# Patient Record
Sex: Female | Born: 1965 | Race: White | Hispanic: No | Marital: Married | Smoking: Former smoker
Health system: Southern US, Community
[De-identification: ages and names within clinical notes are randomized; demographics above are authoritative.]

## PROBLEM LIST (undated history)

## (undated) DIAGNOSIS — F329 Major depressive disorder, single episode, unspecified: Secondary | ICD-10-CM

## (undated) DIAGNOSIS — F32A Depression, unspecified: Secondary | ICD-10-CM

## (undated) DIAGNOSIS — A64 Unspecified sexually transmitted disease: Secondary | ICD-10-CM

## (undated) DIAGNOSIS — R42 Dizziness and giddiness: Secondary | ICD-10-CM

## (undated) DIAGNOSIS — F419 Anxiety disorder, unspecified: Secondary | ICD-10-CM

## (undated) DIAGNOSIS — F909 Attention-deficit hyperactivity disorder, unspecified type: Secondary | ICD-10-CM

## (undated) HISTORY — DX: Depression, unspecified: F32.A

## (undated) HISTORY — DX: Unspecified sexually transmitted disease: A64

## (undated) HISTORY — PX: ABDOMINAL HYSTERECTOMY: SHX81

## (undated) HISTORY — PX: DENTAL SURGERY: SHX609

## (undated) HISTORY — DX: Anxiety disorder, unspecified: F41.9

## (undated) HISTORY — DX: Major depressive disorder, single episode, unspecified: F32.9

## (undated) HISTORY — DX: Attention-deficit hyperactivity disorder, unspecified type: F90.9

## (undated) HISTORY — PX: TONSILLECTOMY: SUR1361

---

## 1998-09-01 ENCOUNTER — Other Ambulatory Visit: Admission: RE | Admit: 1998-09-01 | Discharge: 1998-09-01 | Payer: Self-pay | Admitting: Gynecology

## 1999-07-19 HISTORY — PX: AUGMENTATION MAMMAPLASTY: SUR837

## 2000-03-13 ENCOUNTER — Other Ambulatory Visit: Admission: RE | Admit: 2000-03-13 | Discharge: 2000-03-13 | Payer: Self-pay | Admitting: Gynecology

## 2001-03-13 ENCOUNTER — Other Ambulatory Visit: Admission: RE | Admit: 2001-03-13 | Discharge: 2001-03-13 | Payer: Self-pay | Admitting: Gynecology

## 2002-03-25 ENCOUNTER — Other Ambulatory Visit: Admission: RE | Admit: 2002-03-25 | Discharge: 2002-03-25 | Payer: Self-pay | Admitting: Gynecology

## 2004-12-05 ENCOUNTER — Emergency Department: Payer: Self-pay | Admitting: Unknown Physician Specialty

## 2005-03-09 ENCOUNTER — Ambulatory Visit: Payer: Self-pay

## 2005-03-23 ENCOUNTER — Ambulatory Visit: Payer: Self-pay | Admitting: Otolaryngology

## 2005-03-23 HISTORY — PX: CYST EXCISION: SHX5701

## 2005-06-22 ENCOUNTER — Other Ambulatory Visit: Admission: RE | Admit: 2005-06-22 | Discharge: 2005-06-22 | Payer: Self-pay | Admitting: Gynecology

## 2007-09-07 ENCOUNTER — Other Ambulatory Visit: Admission: RE | Admit: 2007-09-07 | Discharge: 2007-09-07 | Payer: Self-pay | Admitting: Gynecology

## 2007-09-07 ENCOUNTER — Ambulatory Visit: Payer: Self-pay | Admitting: Family Medicine

## 2008-04-09 ENCOUNTER — Inpatient Hospital Stay (HOSPITAL_COMMUNITY): Admission: EM | Admit: 2008-04-09 | Discharge: 2008-04-15 | Payer: Self-pay | Admitting: Emergency Medicine

## 2008-04-10 ENCOUNTER — Encounter: Payer: Self-pay | Admitting: Internal Medicine

## 2008-04-11 ENCOUNTER — Ambulatory Visit: Payer: Self-pay | Admitting: Hematology and Oncology

## 2008-04-11 ENCOUNTER — Ambulatory Visit: Payer: Self-pay | Admitting: Internal Medicine

## 2008-04-21 ENCOUNTER — Other Ambulatory Visit (HOSPITAL_COMMUNITY): Admission: RE | Admit: 2008-04-21 | Discharge: 2008-07-20 | Payer: Self-pay | Admitting: Psychiatry

## 2009-09-17 ENCOUNTER — Other Ambulatory Visit: Admission: RE | Admit: 2009-09-17 | Discharge: 2009-09-17 | Payer: Self-pay | Admitting: Gynecology

## 2009-09-17 ENCOUNTER — Ambulatory Visit: Payer: Self-pay | Admitting: Women's Health

## 2010-11-30 NOTE — Consult Note (Signed)
NAMEMarland Kitchen  LAWRIE, TUNKS NO.:  [id number]   MEDICAL RECORD NO.:  [id number]          PATIENT TYPE:  INP   LOCATION:                               FACILITY:  [facility]   PHYSICIAN:  Antonietta Breach, M.D.  DATE OF BIRTH:  1965-10-28   DATE OF CONSULTATION:  04/10/2008  DATE OF DISCHARGE:                                 CONSULTATION   REQUESTING PHYSICIAN:  [facility].   REASON FOR CONSULTATION:  Depression, polysubstance overdose.   HISTORY OF PRESENT ILLNESS:  Linda Hubbard is a 45 year old female  admitted to the [facility] on April 09, 2008 after an  overdose.   Linda Hubbard is a limited historian at this time.  She is irritable and  resents addressing her history and treatment with the undersigned.  The  undersigned did not reveal any information to her husband, but her  husband provided history for the undersigned.   Mr. Hubbard states that Linda Hubbard overdosed on medication  approximately 1 month ago in a suicide attempt.  She has been having  depressed mood, low energy, poor concentration and anhedonia as well as  insomnia.  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   She stopped her Prozac and had been on it for antidepression.  She has  been followed by a local psychiatrist in town.   Linda Hubbard was found by her husband in a stupor confused behind the  wheel of her car.  There was an empty bottle of Ambien.  She was brought  to the emergency room and was combative in the emergency room.  She  required chemical restraint with Geodon 20 mg and Atacand 2 mg.  She did  require restraints physically as well.   Please see the undersigned's mental status exam.   PAST PSYCHIATRIC HISTORY:  Linda Hubbard does have a history of prior  suicide attempt approximately 1 month ago.  She has never been a  psychiatric hospital.  She does not have any history of decreased  need  for sleep or increased energy.  However, she does have a history of  several day periods of insomnia where she was miserable and wanted to  sleep.  She has no history of hallucinations.  She has been treated with  Prozac by a local psychiatrist.   FAMILY PSYCHIATRIC HISTORY:  The patient's sister has depression.   SOCIAL HISTORY:  Linda Hubbard lives in [place] with her husband.  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  She does have two children with  the youngest one 71.   Of note, Linda Hubbard's mother died of cancer when Linda Hubbard was 45  years old.   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  At this time, this history is not confirmed.  However, it seems very  likely based upon the husband's description.  He states that Mrs.  Hubbard had a benign lesion removal from her tongue approximately 2  years ago.  He also states  that he has seen the extent of the lesion on  her tongue now.  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   MEDICATIONS:  MAR is reviewed.  Linda Hubbard is on the psychotropic  Ativan 1 mg every hour p.r.n.   No known drug allergies.   LABORATORY DATA:  WBC 10.3, hemoglobin 15.3, platelet count 201.  Sodium  140, BUN 15, creatinine 0.78.  Urine drug screen positive for  amphetamines, benzodiazepines and opioids.  Aspirin, alcohol, Tylenol, HCG, urine all unremarkable.   REVIEW OF SYSTEMS:  CONSTITUTIONAL, HEAD, EYES, EARS, NOSE, THROAT,  MOUTH, NEUROLOGIC, PSYCHIATRIC, CARDIOVASCULAR, RESPIRATORY,  GASTROINTESTINAL, GENITOURINARY, SKIN, MUSCULOSKELETAL, HEMATOLOGIC,  LYMPHATIC, ENDOCRINE METABOLIC:  All negative as far as can be  ascertained from the general medical record and the husband.   EXAMINATION:  VITAL SIGNS:  Temperature 98.3, pulse 83, respiratory rate  22, blood pressure 105/59, O2 saturation on room air 100%.  GENERAL APPEARANCE:   Linda Hubbard is a middle-aged female sitting up  and moving side to side in her hospital bed with no abnormal involuntary  movements.  Her movements are purposeful.   MENTAL STATUS EXAM:  At the time of the examination, Linda Hubbard is  waking up from a nap.  She has mild decreased attention, mild decreased  concentration.  However, she does have coherent thought process.  Her  affect is irritable.  Her mood is depressed.  She is oriented to all  spheres except that she states that she is at [facility].  She  provides limited history.  She clearly resents being seen by the  undersigned.  Although she is alert, after a brief discussion, she lies  down on her bed and will no longer talk to the undersigned.  However,  before she does that she states that this clearly was a suicide attempt  and that she continues to have suicidal intent.  When asked if she would  be willing to proceed with an antidepressant course, she said no.  Also  when asked if she would be willing to have further evaluation and  treatment of her oral buccal lesion, she said no.  Other thought  content:  There is no evidence at this time of hallucinations or  delusions.  Her insight and judgment are grossly impaired.  Her fund of  knowledge and intelligence are grossly within normal limits.   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  Axis II:  Deferred.  Axis III:  See past medical history section above.  Axis IV:  General medical.  Axis V:  15.   Linda Hubbard clearly demonstrates a risk for suicide.  She also has  impaired judgment secondary to her mood states.   Her depression can compromise her capacity for informed consent because  depression can  undermine a patient's ability to appreciate the positive  aspects and elements of once's life and purpose even in the face of  adverse treatment effects and crippling effects of treatment.   RECOMMENDATIONS:  1. Would admit to a psychiatric ward when medically cleared.  2. Would recommend that a specialist be consulted for further      assessment of Linda Hubbard's potential cancer.  3. Ego support to facilitate a therapeutic alliance.  4. Would start Effexor for antidepression at 37.5 mg daily.  Then      would increase while monitoring blood pressure side-effect by 37.5      mg every 2-3 days to the target dose of 150 mg q.a.m.  5. The undersigned spent time with the husband in obtaining history,      in discussing the case with the staff as well as discussing the      case with the attending physician.   Total time on case including paperwork:  2 hours.      Antonietta Breach, M.D.  Electronically Signed     JW/MEDQ  D:  04/10/2008  T:  04/10/2008  Job:  [id number]

## 2010-11-30 NOTE — Consult Note (Signed)
Hubbard, Linda             ACCOUNT NO.:  [id number]   MEDICAL RECORD NO.:  [id number]          PATIENT TYPE:  INP   LOCATION:                               FACILITY:  [facility]   PHYSICIAN:  Lauretta I. Odogwu, M.D.DATE OF BIRTH:  [birth year]   DATE OF CONSULTATION:  04/15/2008  DATE OF DISCHARGE:                                 CONSULTATION   REFERRING PHYSICIAN:  [facility]   REASON FOR CONSULTATION:  Oral cancer.   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  She is currently in the ICU, she is being monitored.  The  patient has a past medical history significant for tobacco use and oral  cancer.  The patient claims that following her diagnosis of tongue  cancer, she received radiation therapy at [facility].  However, she could not recall the physician involved and when  she received these treatments.  It was recently communicated to her that  she would require chemotherapy which led her to commit attempted  suicide.  At this time, no other information is forthcoming from the  patient.  The only remarkable evidence is that there is a slightly  raised plaque area on the tongue.  There is no palpable adenopathy.  The  lab work is essentially unremarkable.   PAST MEDICAL HISTORY:  1. History of depression.  2. History of cancer of the tongue as above.  3. Tobacco use.   SURGERIES:  None.   ALLERGIES:  NKDA.   MEDICATIONS:  Zosyn, Cleocin, vancomycin, Effexor, Protonix, K-Dur,  Lovenox, Haldol, Ativan, Roxicodone, Zofran, ibuprofen.   REVIEW OF SYSTEMS:  See HPI for significant positives.  She has been  experiencing night sweats for the last few months; she states that her  shortness of breath has increased over the last 2 months.  No nausea,  vomiting or diarrhea.  However, she states that recently she has seen  blood in the stools, resolved  with wiping.  Moreover, she complains of  right hip pain consistent with an area of swelling as stated per MRI.  She experienced a weight loss of 15-20 pounds over the last 6 months.  She has fatigue.  Rest of her review of systems is negative.   FAMILY HISTORY:  Mother died with cancer when the patient was 7 years  all.  Her father has unremarkable medical history.  The patient's sister  has a history of depression.   SOCIAL HISTORY:  The patient is married.  She has 4 grown children.  She  has smoked 1-1/2 packs of tobacco a day for the last 20 years.  Occasionally the patient has some alcohol.  Lives in [place].  She  works at the [employer] as a Engineer, civil (consulting).   PHYSICAL EXAMINATION:  GENERAL:  This is a thin 45 year old white female  in no acute distress, alert and oriented x3 but anxious.  VITAL SIGNS:  Blood pressure 92/62, pulse 66, respirations 18,  temperature 98.9, pulse oximetry 98% in room air.  HEENT/NECK:  Normocephalic, atraumatic.  PERRLA.  Oral cavity remarkable  for right white plaques, mildly raised with no other areas of  abnormalities in the tongue.  There is no palpable adenopathy.  In  addition, the left upper lid shows an irregular ulceration, dry,  nontender, which according to the patient has been present for at least  4 years.  LUNGS:  Clear to auscultation bilaterally.  CARDIOVASCULAR:  Regular rate and rhythm without murmurs, rubs or  gallops.  ABDOMEN:  Soft, nontender.  Bowel sounds x4.  No hepatosplenomegaly.  EXTREMITIES:  With no clubbing or cyanosis.  No edema.  No inguinal  masses.  SKIN:  Remarkable for the changes mentioned above in her lip and tongue.  In addition, there is an area of right tenderness to palpation in the  hip with no areas of erythema which is now being evaluated for right hip  effusion versus focal myositis versus muscle abscess versus septic  arthritis or muscle necrosis.  BREASTS:  Not examined.  GU/RECTAL:  Deferred.   MUSCULOSKELETAL:  With no spinal tenderness.  NEURO:  Nonfocal.   LABORATORIES:  Hemoglobin 15.3, hematocrit 45.4, platelets 201, white  count 10.3, MCV 99.3, ANC 6.4, monocytes 0.8, lymphocytes 3, sodium 139,  potassium 3.6, BUN 8, creatinine 0.58, glucose 120, total bilirubin 0.6,  alkaline phosphatase 42, AST 47, ALT 23, total protein 4.4, albumin 2.6,  calcium 7.9, magnesium 1.7.   ASSESSMENT/PLAN:  Dr. Dalene Carrow has seen and evaluated the patient with the  chart.  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  Apparently, this is a diagnosis that has been given to her, but the  history at this point is vague.  It is important to mention that Dr.  Dalene Carrow has discussed with her situation, and she has agreed to release  her information from the [facility] and [facility] for further  review. Once the data arrives from the pertinent hospitals, we will be  able to proceed with further recommendations.  At this time, we will  check a brain CT scan as well as a CT of the neck and chest.  Based on  these results, we will proceed with further recommendations.   Thank you very much for allowing [place] the opportunity to participate in  the care of this patient.      Linda Hubbard, P.A.      Lauretta I. Odogwu, M.D.  Electronically Signed    SW/MEDQ  D:  04/11/2008  T:  04/11/2008  Job:  [id number]

## 2010-11-30 NOTE — Consult Note (Signed)
NAMESAYDI, KOBEL NO.:  [id number]   MEDICAL RECORD NO.:  [id number]          PATIENT TYPE:  INP   LOCATION:                               FACILITY:  [facility]   PHYSICIAN:  Antonietta Breach, M.D.  DATE OF BIRTH:  April 09, 1966   DATE OF CONSULTATION:  04/14/2008  DATE OF DISCHARGE:  04/10/2008                                 CONSULTATION   CONSULTATION FOLLOW UP:   Linda Hubbard is feeling much better now.  She has intact interests and  future hope.  Her energy is still mildly decreased.  Her concentration  is within normal limits.  She is not having any hallucinations or  delusions.  She does acknowledge that she did attempt suicide with the  overdose.   However, she has an appropriate level of regret about the overdose and a  determination to never attempt suicide again.  She has been receiving a  tremendous amount of support from her family and does realize what kind  of impact that a suicide would have on them.  She has been thinking  through her medical condition and is confident that her current  evaluation will provide her some input and a way of dealing with her  medical illness constructively.  She already has received some  encouraging initial findings from her physical exam that suggests that  the lesion on her tongue may not have been as severe as she once had  thought.   REVIEW OF SYSTEMS:  CARDIOVASCULAR:  Her blood pressure has remained  stable without increase while on the Effexor   PHYSICAL EXAMINATION:  VITAL SIGNS:  Temperature 97.5, pulse 71,  respiratory rate 20, blood pressure 100/62.  O2 saturation on room air  98%.   MENTAL STATUS EXAM:  Linda Hubbard is alert.  Her eye contact is good.  Her concentration is within normal limits.  Her attention span is within  normal limits.  Her affect is broad and appropriate.  Mood is slightly  anxious.  She is oriented to all spheres.  Memory is intact to  immediate, recent and remote, except for  the period around the overdose.  Her fund of knowledge and intelligence are within normal limits.  Speech  involves normal rate and prosody without dysarthria.  Thought process is  logical, coherent and goal-directed.  No looseness of associations.  Thought content; no thoughts of harming herself, no thoughts of harming  others, no delusions, no hallucinations.  Insight is intact.  Judgment  is intact.   ASSESSMENT:  AXIS I:  293.83, mood disorder, not otherwise specified.  1. Rule out 296.33, major depressive disorder, recurrent, severe.      Linda Hubbard does appear to have had a significant improvement in      her mood through her psychosocial support with a large number of      people in her support system.   Linda Hubbard is no longer at risk to harm herself.  She does agree to  call emergency services immediately for any thoughts of harming herself,  thoughts of harming others or distress.   The undersigned  provided ego supportive psychotherapy and education.   The indications, alternatives and adverse effects of Effexor were  discussed with the patient for anti-depression, including the risk of  high blood pressure, as well as withdrawal effects.   She understands and would like to proceed with Effexor.   Also the indications, alternatives and adverse effects of trazodone were  reviewed with the patient for anti-insomnia, as well as augmenting  Effexor.  She understands and would like to proceed with trazodone.  She  agrees to not drive if drowsy.   RECOMMENDATIONS:  1. Would start trazodone at 25 mg nightly, and then would increase by      25 mg per night as needed for solid sleep, not to exceed 200 mg      nightly.  2. Would proceed with increasing the Effexor while monitoring blood      pressure to the target dose of 75 mg b.i.d. in the generic form.  3. Linda Hubbard declines inpatient psychiatric care, although      inpatient psychiatric care is recommended.  She  would like to      proceed with intensive outpatient, and she is no longer committable      after recovering from her support.   Therefore, the undersigned will ask the case manager to set Mrs.  Hubbard up with an intensive outpatient program at [facility], [facility], [facility] or [place].  There, she will receive  psychotherapy for coping skills, stress management, as well as  additional medication management.      Antonietta Breach, M.D.  Electronically Signed     JW/MEDQ  D:  04/15/2008  T:  04/15/2008  Job:  [id number]

## 2010-11-30 NOTE — H&P (Signed)
NAMEMarland Hubbard  Linda, Hubbard NO.:  [id number]   MEDICAL RECORD NO.:  [id number]          PATIENT TYPE:  INP   LOCATION:  1229                         FACILITY:  [facility]   PHYSICIAN:  Vania Rea, M.D. DATE OF BIRTH:  1966-02-23   DATE OF ADMISSION:  04/09/2008  DATE OF DISCHARGE:                              HISTORY & PHYSICAL   PRIMARY CARE PHYSICIAN:  Evelene Croon, M.D in [place].   CHIEF COMPLAINT:  Multi drug overdose.   HISTORY OF PRESENT ILLNESS:  This is a 45 year old Caucasian lady who  was brought in by ambulance after she was found confused and drowsy at  the wheel of her car. The patient's husband found her pretty much at the  same time that the sheriff's department found her, and he gives a  history that she has been calling him and other family members last  night telling them that she had been that she had been diagnosed with  cancer of the tongue and had been keeping a secret from them.  They  noted she was supposed to be at work but they he became concerned when  he found out she did not report to work and she was not at home.  The  patient was found with an empty bottle of Ambien inside the car and  husband reports this bottle was filled 3 days ago.  The patient was  brought to the emergency room.  In the emergency room the patient became  more alert and combative and had to be placed in restraints.  Urine drug  screen was checked and was found to be positive for amphetamines,  opiates and benzodiazepines.  Other than this, her history is  incomplete.  The patient lives in [place]. Works at the [employer].  The prescription for Ambien was given by Luvenia Starch .  However, we  been unable to locate that doctor.  It is unclear who is treating the  patient's tongue cancer or what sort of treatment she has had.   PAST MEDICAL HISTORY:  1. History of depression.  2. History of oral cancer of unknown duration.   MEDICATIONS:  1. Dilaudid 4  mg every 4 hours.  2. Ambien 10 mg at bedtime.  3. Multivitamins one daily.   ALLERGIES:  NO KNOWN DRUG ALLERGIES.   SOCIAL HISTORY:  She is a Engineer, civil (consulting) at the [employer].  She smokes probably  one and a half packs per day.  Alcohol use occasionally.  No illicit  drug use known.   FAMILY HISTORY:  The patient's mother reportedly died of cancer when the  patient was age 33.  She has a sister who suffers with depression.   REVIEW OF SYSTEMS:  Other than noted above, 10-point review of systems  is unable to obtain.  The patient is currently complaining of right hip  and thigh pain.   PHYSICAL EXAMINATION:  GENERAL:  A young Caucasian lady lying in the  stretcher in wrist restraints, combative.  VITAL SIGNS:  Temperature is 97.6, pulse 92, respirations 20, blood  pressure 131/76.  She is saturating at 100% on  room air.  HEENT:  Pupils round, equal and reactive.  Mucous membranes pink and  anicteric.  NECK:  She has no cervical lymphadenopathy or thyromegaly or jugular  venous distention.  CHEST:  Clear to auscultation bilaterally.  CARDIOVASCULAR:  Regular rhythm without murmur.  ABDOMEN:  Scaphoid, soft and nontender.  EXTREMITIES:  She is tender over the right buttock and right thigh.  There is no external evidence of bruising and she has no edema.  She has  2+ dorsalis pedis peripheral pulses.  CENTRAL NERVOUS SYSTEM:  She does not have any facial asymmetry and she  moves all limbs equally.   LABORATORY DATA:  Her white count is 10.3, hemoglobin 15.3, MCV 99.3,  platelets 201,000.  She has normal distribution of her white count.  Her  serum chemistry and liver function tests are remarkable only for  slightly elevated AST.  Her urinalysis was significant only for trace  ketones and specific gravity of 1.027.  Her urine drug screen was  positive for opiates, benzodiazepines and amphetamines.   ASSESSMENT:  1. Ambien overdose, possibly multi drug overdose, possible      polysubstance  abuse in view of amphetamines and benzodiazepines on      urine drug screen.  2. History of oral cancer.  3. Parasuicide.   PLAN:  Will admit this lady for observation for safety. She will be  given IV fluids and hopefully within 24 hours, the drugs will be out of  her system so she can be evaluated by the [facility].  When the patient is more alert and appropriate, we should be able to get  further details of her primary physicians and her current status of  medical care, particularly in regards to cancer.      Vania Rea, M.D.  Electronically Signed     LC/MEDQ  D:  04/09/2008  T:  04/10/2008  Job:  [id number]   cc:   Evelene Croon  Fax: [phone]

## 2010-11-30 NOTE — Discharge Summary (Signed)
NAMEBLAKE, GOYA NO.:  [id number]   MEDICAL RECORD NO.:  [id number]          PATIENT TYPE:  INP   LOCATION:  1518                         FACILITY:  [facility]   PHYSICIAN:  Eduard Clos, MDDATE OF BIRTH:  [birth year]   DATE OF ADMISSION:  04/09/2008  DATE OF DISCHARGE:                               DISCHARGE SUMMARY   COURSE IN THE HOSPITAL:  A 45 year old female with a history of  depression and questionable history of oral cancer was brought into the  ER by the family after the patient was found confused and drowsy at the  wheel of her car.  She was brought to the ER, and her urine tox was  positive for amphetamines, opiates, and benzodiazepine.  Patient was  initially drowsy and admitted to the ICU.  Patient was placed on IV  fluids, n.p.o., and a sitter.  Once the patient became more alert and  awake, the patient stated that she was diagnosed with oral cancer and  was treated.  She is supposed to get some surgery for her tongue when  she decided towards suicide.  A psychiatry consult was obtained with Dr.  Jeanie Sewer, who advised her to get an oncologic consult to determine the  stage of her cancer.  Her oncologist, patient underwent a CT chest,  neck, and head, which did not show any significant findings.  The  patient stated that she had treatment done at [facility], for  which we were not able to retrieve any medical records pertaining to her  treatments.  An ENT consult was obtained with Dr. Suszanne Conners, who did  laryngoscope and tongue biopsy, which results are pending now.  At this  time, Dr. Jeanie Sewer did reasseess the patient and started her on  trazodone and Effexor and says she can be followed as an outpatient, and  she is no more suicidal.   During this stay, the patient also did complain of significant pain in  the right hip for which an MRI was done, which showed evidence of  abnormal musculature on the right hip with right hip effusion,  positive  with focal myositis, abscess,  muscle necrosis should be considered as  well as necrotizing fascitis.  An aspiration biopsy was done, which did  not yield any organism.  An infectious disease was obtained with Dr.  Orvan Falconer.  Initially antibiotics were started, which were discontinued  eventually.  At this time, the patient's hip pain is controlled.   At this time, patient is not entertaining any suicidal ideation.  Patient's tongue biopsies are pending.  The patient's family is in  support and in agreement in taking care of her on discharge and will be  following her pending results with Dr. Suszanne Conners to follow up with primary  care physician within a week's time and outpatient psychiatry.   PROCEDURES:  MRI of pelvis shows evidence of abnormal musculature on the  right hip with right hip effusion, possibly due to focal myositis with  an abscess of septic arthritis, muscle necrosis should be considered as  well as necrotizing fasciitis.   CT aspiration done:  No free fluid obtained from the area of the  abnormality in the anterolateral gluteal musculature.   CT of the head without contrast.  Normal examination.  No acute  findings.  No metastatic disease.   CT of the neck:  No mass demonstrated.  No cervical adenopathy.   CT of the chest:  No evidence of thoracic metastatic disease.  No  changes.   FINAL DIAGNOSIS:  1. Depression with intentional suicidal overdose.  Amphetamine,      opiates, and benzodiazepines.  2. History of oral cancer, per patient.  Status post tongue biopsy.  3. Cigarette abuse.   MEDICATIONS ON DISCHARGE:  1. Trazodone 25 mg p.o. at bedtime.  2. Effexor XR 75 mg p.o. in the a.m. and 37.5 mg p.o. at bedtime.  3. Percocet 5/325 mg p.o. q.12h. p.r.n. pain.   PLAN:  Patient is to follow up with her primary care physician, Dr.  Jaynie Crumble in a week's time.  To follow with Dr. Suszanne Conners, ENT surgeon, in a  week's time to follow her pending biopsy results.   Patient has been  provided with the contact information of Dr. Suszanne Conners at [phone] and  was told to call and make an appointment to follow with [facility] as scheduled.  Patient is to have a regular diet.      Eduard Clos, MD  Electronically Signed     ANK/MEDQ  D:  04/15/2008  T:  04/15/2008  Job:  [id number]

## 2011-02-12 ENCOUNTER — Ambulatory Visit: Payer: Self-pay | Admitting: Orthopedic Surgery

## 2011-02-16 ENCOUNTER — Other Ambulatory Visit: Payer: Self-pay | Admitting: *Deleted

## 2011-02-16 MED ORDER — VALACYCLOVIR HCL 500 MG PO TABS: 500.0000 mg | ORAL_TABLET | Freq: Every day | ORAL | Status: AC

## 2011-04-18 LAB — CBC
MCHC: 33.6
MCV: 99.3
Platelets: 173
Platelets: 201
RDW: 13.1
RDW: 13.3

## 2011-04-18 LAB — COMPREHENSIVE METABOLIC PANEL
ALT: 19
ALT: 23
AST: 47 — ABNORMAL HIGH
AST: 50 — ABNORMAL HIGH
Albumin: 3.6
Alkaline Phosphatase: 44
BUN: 7
CO2: 24
CO2: 25
Calcium: 8.9
Chloride: 113 — ABNORMAL HIGH
Creatinine, Ser: 0.58
Creatinine, Ser: 0.69
Creatinine, Ser: 0.78
GFR calc Af Amer: >60
GFR calc Af Amer: >60
GFR calc non Af Amer: >60
GFR calc non Af Amer: >60
Glucose, Bld: 120 — ABNORMAL HIGH
Glucose, Bld: 94
Potassium: 4
Sodium: 139
Sodium: 140
Total Bilirubin: 0.6
Total Bilirubin: 0.7
Total Protein: 4.6 — ABNORMAL LOW
Total Protein: 6

## 2011-04-18 LAB — ANAEROBIC CULTURE

## 2011-04-18 LAB — RAPID URINE DRUG SCREEN, HOSP PERFORMED
Amphetamines: POSITIVE — AB
Barbiturates: NOT DETECTED
Benzodiazepines: POSITIVE — AB
Tetrahydrocannabinol: NOT DETECTED

## 2011-04-18 LAB — BASIC METABOLIC PANEL
CO2: 28
Calcium: 8.3 — ABNORMAL LOW
GFR calc Af Amer: >60
GFR calc non Af Amer: >60
Sodium: 139

## 2011-04-18 LAB — DIFFERENTIAL
Eosinophils Relative: 0
Lymphocytes Relative: 29
Lymphs Abs: 3
Monocytes Relative: 8

## 2011-04-18 LAB — URINALYSIS, ROUTINE W REFLEX MICROSCOPIC
Bilirubin Urine: NEGATIVE
Glucose, UA: NEGATIVE
Hgb urine dipstick: NEGATIVE
Nitrite: NEGATIVE
Protein, ur: NEGATIVE
Specific Gravity, Urine: 1.027
Urobilinogen, UA: 0.2
pH: 6.5

## 2011-04-18 LAB — ACETAMINOPHEN LEVEL: Acetaminophen (Tylenol), Serum: <10 — ABNORMAL LOW

## 2011-04-18 LAB — CK: Total CK: 780 — ABNORMAL HIGH

## 2011-07-04 ENCOUNTER — Other Ambulatory Visit: Payer: Self-pay | Admitting: *Deleted

## 2011-07-06 MED ORDER — VALACYCLOVIR HCL 500 MG PO TABS: 500.0000 mg | ORAL_TABLET | Freq: Two times a day (BID) | ORAL | Status: AC

## 2013-05-22 ENCOUNTER — Emergency Department: Payer: Self-pay | Admitting: Emergency Medicine

## 2014-12-18 ENCOUNTER — Telehealth: Payer: Self-pay | Admitting: Psychiatry

## 2014-12-23 MED ORDER — AMPHETAMINE-DEXTROAMPHET ER 20 MG PO CP24: 20.0000 mg | ORAL_CAPSULE | Freq: Two times a day (BID) | ORAL | Status: DC

## 2014-12-23 NOTE — Telephone Encounter (Signed)
Patient is content to the clinic requesting a refill of her Adderall 20 mg, twice daily. 20 mg tablets #60 with no refills  Records she is due for this refill. Will provide her copy prescription and patient will come by and pick up prescription.

## 2015-01-21 ENCOUNTER — Telehealth: Payer: Self-pay | Admitting: Psychiatry

## 2015-01-26 MED ORDER — AMPHETAMINE-DEXTROAMPHETAMINE 20 MG PO TABS: 20.0000 mg | ORAL_TABLET | Freq: Two times a day (BID) | ORAL | Status: DC

## 2015-01-26 MED ORDER — AMPHETAMINE-DEXTROAMPHET ER 20 MG PO CP24: 20.0000 mg | ORAL_CAPSULE | Freq: Two times a day (BID) | ORAL | Status: DC

## 2015-02-19 ENCOUNTER — Ambulatory Visit (INDEPENDENT_AMBULATORY_CARE_PROVIDER_SITE_OTHER): Payer: PRIVATE HEALTH INSURANCE | Admitting: Psychiatry

## 2015-02-19 ENCOUNTER — Encounter: Payer: Self-pay | Admitting: Psychiatry

## 2015-02-19 VITALS — BP 124/78 | HR 80 | Temp 98.5°F | Ht 62.0 in | Wt 153.2 lb

## 2015-02-19 DIAGNOSIS — F900 Attention-deficit hyperactivity disorder, predominantly inattentive type: Secondary | ICD-10-CM

## 2015-02-19 DIAGNOSIS — F411 Generalized anxiety disorder: Secondary | ICD-10-CM | POA: Diagnosis not present

## 2015-02-19 MED ORDER — AMPHETAMINE-DEXTROAMPHET ER 20 MG PO CP24: 20.0000 mg | ORAL_CAPSULE | Freq: Two times a day (BID) | ORAL | Status: DC

## 2015-02-19 MED ORDER — ZOLPIDEM TARTRATE 10 MG PO TABS: 10.0000 mg | ORAL_TABLET | Freq: Every evening | ORAL | Status: DC | PRN

## 2015-02-19 MED ORDER — ALPRAZOLAM 0.25 MG PO TABS: 0.2500 mg | ORAL_TABLET | Freq: Three times a day (TID) | ORAL | Status: DC | PRN

## 2015-02-19 NOTE — Progress Notes (Signed)
BH MD/PA/NP OP Progress Note  02/19/2015 10:52 AM Linda Hubbard  MRN:  [id number]  Subjective:  Patient returns for follow-up for her ADHD and anxiety. She explained in this appointment that some of her issues with returning to work stem from some past trauma and stalking issues from a former relationship. She stated she has fear of this particular person and thus largely stays in her home and avoids situations such as going to the grocery store. She states even discussing the issue can bring her intense anxiety. She relates that she is not experiencing any stalking recently but she does have the fear that it is going on. For example she states if she finds a cigarette but as the stocker used to smoke cigarettes that he may have been in the area. Patient raised that she might have some PTSD. I agreed she had some symptoms of PTSD and discussed that some options for that might be antidepressants and/or therapy. Patient states that she is not interested in those that she had multiple side effects from antidepressants. She states that in regards to therapy she's not interested in it at this time and she does fine with symptoms when she is out of this geographical area such as on an out-of-state vacation with her family. She dates her husband has been supportive in this issue. He says they have several upcoming vacations and trips which she looks forward to.  As in past visits she has discussed that the Adderall may not be effective for her as it was in the past. She discussed some other medications which she has had problems with. She states the Concerta made her irritable and angry. She states Vyvanse she had only been on a low dose of she's not sure about that. However she recalls that this writer had given her some Adderall XR in the past and she feels like that actually was more effective than the immediate release and prefers to try that at this time. I discussed that it so once a day medicine however she  stated when she took it once a day it put her to sleep and thus when she divides the dose it is effective for her.  Chief Complaint: Stalking Chief Complaint    Follow-up; Medication Refill; Panic Attack; Anxiety; Fatigue     Visit Diagnosis:  No diagnosis found.  Past Medical History:  Past Medical History  Diagnosis Date  . Anxiety   . ADHD (attention deficit hyperactivity disorder)   . Depression     Past Surgical History  Procedure Laterality Date  . Abdominal hysterectomy    . Tonsillectomy     Family History:  Family History  Problem Relation Age of Onset  . Breast cancer Mother   . Hypertension Sister   . Alcohol abuse Maternal Grandfather   . Diabetes Maternal Grandmother   . Atrial fibrillation Maternal Grandmother    Social History:  History   Social History  . Marital Status: Married    Spouse Name: N/A  . Number of Children: N/A  . Years of Education: N/A   Social History Main Topics  . Smoking status: Former Smoker -- 1.00 packs/day    Types: Cigarettes    Start date: 02/18/1985    Quit date: 02/18/2013  . Smokeless tobacco: Never Used  . Alcohol Use: No  . Drug Use: No  . Sexual Activity: No   Other Topics Concern  . None   Social History Narrative  . None   Additional  History:   Assessment:   Musculoskeletal: Strength & Muscle Tone: within normal limits Gait & Station: normal Patient leans: N/A  Psychiatric Specialty Exam: HPI  Review of Systems  Psychiatric/Behavioral: Negative for depression, suicidal ideas, hallucinations, memory loss and substance abuse. The patient is nervous/anxious (some general worries about children and stalking as discussed above) and has insomnia (Response to Ambien).     Blood pressure 124/78, pulse 80, temperature 98.5 F (36.9 C), temperature source Tympanic, height  (1.575 m), weight 153 lb 3.2 oz (69.491 kg), SpO2 96 %.Body mass index is 28.01 kg/(m^2).  General Appearance: Neat and Well  Groomed  Eye Contact:  Good  Speech:  Normal Rate  Volume:  Normal  Mood:  Good  Affect:  Euthymic, anxious when discussing the issues with the stalking  Thought Process:  Linear and Logical  Orientation:  Full (Time, Place, and Person)  Thought Content:  Negative  Suicidal Thoughts:  No  Homicidal Thoughts:  No  Memory:  Immediate;   Good Recent;   Good Remote;   Good  Judgement:  Good  Insight:  Good  Psychomotor Activity:  Negative  Concentration:  Good  Recall:  Good  Fund of Knowledge: Good  Language: Good  Akathisia:  Negative  Handed:  Right unknown   AIMS (if indicated):  n/A  Assets:  Communication Skills Desire for Improvement Social Support Vocational/Educational  ADL's:  Intact  Cognition: WNL  Sleep:  good   Is the patient at risk to self?  No. Has the patient been a risk to self in the past 6 months?  No. Has the patient been a risk to self within the distant past?  No. Is the patient a risk to others?  No. Has the patient been a risk to others in the past 6 months?  No. Has the patient been a risk to others within the distant past?  No.  Current Medications: Current Outpatient Prescriptions  Medication Sig Dispense Refill  . ALPRAZolam (XANAX) 0.25 MG tablet Take 1 tablet (0.25 mg total) by mouth 3 (three) times daily as needed for anxiety. 90 tablet 3  . amphetamine-dextroamphetamine (ADDERALL XR) 20 MG 24 hr capsule Take 1 capsule (20 mg total) by mouth 2 (two) times daily. 60 capsule 0  . amphetamine-dextroamphetamine (ADDERALL) 20 MG tablet Take 1 tablet (20 mg total) by mouth 2 (two) times daily. 60 tablet 0  . valACYclovir (VALTREX) 1000 MG tablet take 2 tablets by mouth twice a day for ONE DAY TAKEN 12 HOURS APART  0  . zolpidem (AMBIEN) 10 MG tablet Take 1 tablet (10 mg total) by mouth at bedtime as needed for sleep. 30 tablet 3   No current facility-administered medications for this visit.    Medical Decision Making:  Established Problem,  Stable/Improving (1) and Review of Medication Regimen & Side Effects (2)  Treatment Plan Summary:Medication management and Plan Patient has remained stable on the current medication regimen. We will continue her on her medications. However we'll change her Adderall regular release to Adderall XR. We will continue her on her Ambien 10 mg at bedtime, patient states and then she'll take a half a tablet instead of a whole tablet. We'll continue her on her alprazolam 0.25 mg 3 times a day as needed for anxiety. Patient will follow up in 4 months. She is aware she should call with any questions concerns prior to next appointment and for refills for her Adderall XR.   Wallace Going 02/19/2015, 10:52 AM

## 2015-03-19 ENCOUNTER — Telehealth: Payer: Self-pay

## 2015-03-19 MED ORDER — AMPHETAMINE-DEXTROAMPHET ER 20 MG PO CP24: 20.0000 mg | ORAL_CAPSULE | Freq: Two times a day (BID) | ORAL | Status: DC

## 2015-03-19 NOTE — Telephone Encounter (Signed)
phone number disconnect could not leave a message

## 2015-03-19 NOTE — Telephone Encounter (Signed)
pt needs refill on adderall xr , pt last seen on 02-19-15 . next appt 06-22-15.

## 2015-03-24 NOTE — Telephone Encounter (Signed)
pt came by to pick up rx

## 2015-04-22 ENCOUNTER — Telehealth: Payer: Self-pay | Admitting: Psychiatry

## 2015-04-22 MED ORDER — AMPHETAMINE-DEXTROAMPHET ER 20 MG PO CP24: 20.0000 mg | ORAL_CAPSULE | Freq: Two times a day (BID) | ORAL | Status: DC

## 2015-04-22 NOTE — Telephone Encounter (Signed)
rx is ready for pick up.  adderall xr   id # [id number]  order # [id number].

## 2015-05-29 ENCOUNTER — Other Ambulatory Visit: Payer: Self-pay

## 2015-05-29 MED ORDER — AMPHETAMINE-DEXTROAMPHET ER 20 MG PO CP24: 20.0000 mg | ORAL_CAPSULE | Freq: Two times a day (BID) | ORAL | Status: DC

## 2015-05-29 NOTE — Telephone Encounter (Signed)
Could not leave a message pt mail box was full pt was sent a page with our phone number , pt rx for adderall xr is ready for pick up id# [id number] order # [id number]

## 2015-05-29 NOTE — Telephone Encounter (Signed)
pt called states she needs refill on adderall pt last seen  02-19-15 next appt 06-22-15

## 2015-05-29 NOTE — Telephone Encounter (Signed)
pt was called, pt was told that rx was ready for pickup. pt states it will be monday.

## 2015-06-22 ENCOUNTER — Ambulatory Visit: Payer: PRIVATE HEALTH INSURANCE | Admitting: Psychiatry

## 2015-06-26 ENCOUNTER — Ambulatory Visit (INDEPENDENT_AMBULATORY_CARE_PROVIDER_SITE_OTHER): Payer: PRIVATE HEALTH INSURANCE | Admitting: Psychiatry

## 2015-06-26 ENCOUNTER — Encounter: Payer: Self-pay | Admitting: Psychiatry

## 2015-06-26 DIAGNOSIS — F900 Attention-deficit hyperactivity disorder, predominantly inattentive type: Secondary | ICD-10-CM

## 2015-06-26 DIAGNOSIS — F411 Generalized anxiety disorder: Secondary | ICD-10-CM | POA: Diagnosis not present

## 2015-06-26 MED ORDER — ALPRAZOLAM 0.25 MG PO TABS: 0.2500 mg | ORAL_TABLET | Freq: Three times a day (TID) | ORAL | Status: DC | PRN

## 2015-06-26 MED ORDER — ZOLPIDEM TARTRATE 10 MG PO TABS
10.0000 mg | ORAL_TABLET | Freq: Every evening | ORAL | Status: DC | PRN
Start: 2015-06-26 — End: 2015-09-18

## 2015-06-26 MED ORDER — AMPHETAMINE-DEXTROAMPHET ER 30 MG PO CP24: 30.0000 mg | ORAL_CAPSULE | Freq: Every day | ORAL | Status: DC

## 2015-06-26 NOTE — Progress Notes (Signed)
BH MD/PA/NP OP Progress Note  06/26/2015 12:56 PM Linda Hubbard  MRN:  [id number]  Subjective:  Patient returns for follow-up for her ADHD and anxiety. She related overall her medications are "good." She did state that she was talking things over with her husband in considering at some point coming off of these medications. I discussed with her that I felt it might be better that her Adderall be taken once a day given a XR and that we also reduce it to 30 mg in the morning. Patient was agreeable to this plan.  Otherwise she is feeling some stress from the holidays because she has a tendency to get overly involved and bring on stress and anxiety. She states that she's made efforts over the years to simplify things.  Chief Complaint: Holidays  Visit Diagnosis:     ICD-9-CM ICD-10-CM   1. 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 Anxiety state 300.00 F41.1     Past Medical History:  Past Medical History  Diagnosis Date  . Anxiety   . ADHD (attention deficit hyperactivity disorder)   . Depression     Past Surgical History  Procedure Laterality Date  . Abdominal hysterectomy    . Tonsillectomy     Family History:  Family History  Problem Relation Age of Onset  . Breast cancer Mother   . Hypertension Sister   . Alcohol abuse Maternal Grandfather   . Diabetes Maternal Grandmother   . Atrial fibrillation Maternal Grandmother    Social History:  Social History   Social History  . Marital Status: Married    Spouse Name: N/A  . Number of Children: N/A  . Years of Education: N/A   Social History Main Topics  . Smoking status: Former Smoker -- 1.00 packs/day    Types: Cigarettes    Start date: 02/18/1985    Quit date: 02/18/2013  . Smokeless tobacco: Never Used  . Alcohol Use: No  . Drug Use: No  . Sexual Activity: No   Other Topics Concern  . Not on file   Social History Narrative  . No narrative on file    Additional History:   Assessment:   Musculoskeletal: Strength & Muscle Tone: within normal limits Gait & Station: normal Patient leans: N/A  Psychiatric Specialty Exam: HPI  Review of Systems  Psychiatric/Behavioral: Negative for depression, suicidal ideas, hallucinations, memory loss and substance abuse. The patient is nervous/anxious (some general worries about children and stalking as discussed above) and has insomnia (Response to Ambien).   All other systems reviewed and are negative.   There were no vitals taken for this visit.There is no weight on file to calculate BMI.  General Appearance: Neat and Well Groomed  Eye Contact:  Good  Speech:  Normal Rate  Volume:  Normal  Mood:  Good  Affect:  Congruent  Thought Process:  Linear and Logical  Orientation:  Full (Time, Place, and Person)  Thought Content:  Negative  Suicidal Thoughts:  No  Homicidal Thoughts:  No  Memory:  Immediate;   Good Recent;   Good Remote;   Good  Judgement:  Good  Insight:  Good  Psychomotor Activity:  Negative  Concentration:  Good  Recall:  Good  Fund of Knowledge: Good  Language: Good  Akathisia:  Negative  Handed:  Right unknown   AIMS (if indicated):  n/A  Assets:  Communication Skills Desire for Improvement Social Support Vocational/Educational  ADL's:  Intact  Cognition:  WNL  Sleep:  good   Is the patient at risk to self?  No. Has the patient been a risk to self in the past 6 months?  No. Has the patient been a risk to self within the distant past?  No. Is the patient a risk to others?  No. Has the patient been a risk to others in the past 6 months?  No. Has the patient been a risk to others within the distant past?  No.  Current Medications: Current Outpatient Prescriptions  Medication Sig Dispense Refill  . ALPRAZolam (XANAX) 0.25 MG tablet Take 1 tablet (0.25 mg total) by mouth 3 (three) times daily as needed for anxiety. 90 tablet 5  . amphetamine-dextroamphetamine  (ADDERALL XR) 30 MG 24 hr capsule Take 1 capsule (30 mg total) by mouth daily. 30 capsule 0  . valACYclovir (VALTREX) 1000 MG tablet take 2 tablets by mouth twice a day for ONE DAY TAKEN 12 HOURS APART  0  . zolpidem (AMBIEN) 10 MG tablet Take 1 tablet (10 mg total) by mouth at bedtime as needed for sleep. 30 tablet 5   No current facility-administered medications for this visit.    Medical Decision Making:  Established Problem, Stable/Improving (1) and Review of Medication Regimen & Side Effects (2)  Treatment Plan Summary:Medication management and Plan   ADHD we'll decrease her Adderall XR from 20 mg twice a day to 30 mg once a day. Patient is fine with this and she does plan ultimately to come off of these medications.   Anxiety We'll continue her on her alprazolam 0.25 mg 3 times a day as needed for anxiety.   Tommy-we will continue her Ambien 10 mg at bedtime as needed.  Patient will follow up in 4 months. She is aware she should call with any questions concerns prior to next appointment and for refills for her Adderall XR. I have given her prescription for Adderall XR to fill this month and an additional 1 to fill in January. She is aware after that she left contact the clinic for additional refills. I also discussed with her my departure in February and that she would be transferred to a new psychiatrist within this clinic.  Wallace Going 06/26/2015, 12:56 PM

## 2015-09-16 ENCOUNTER — Encounter (HOSPITAL_COMMUNITY): Payer: Self-pay | Admitting: Emergency Medicine

## 2015-09-16 ENCOUNTER — Emergency Department (HOSPITAL_COMMUNITY): Payer: 59

## 2015-09-16 ENCOUNTER — Observation Stay (HOSPITAL_COMMUNITY)
Admission: EM | Admit: 2015-09-16 | Discharge: 2015-09-18 | Payer: 59 | Attending: Internal Medicine | Admitting: Internal Medicine

## 2015-09-16 DIAGNOSIS — F489 Nonpsychotic mental disorder, unspecified: Secondary | ICD-10-CM | POA: Diagnosis not present

## 2015-09-16 DIAGNOSIS — G9340 Encephalopathy, unspecified: Secondary | ICD-10-CM | POA: Diagnosis not present

## 2015-09-16 DIAGNOSIS — T68XXXA Hypothermia, initial encounter: Secondary | ICD-10-CM | POA: Diagnosis not present

## 2015-09-16 DIAGNOSIS — F909 Attention-deficit hyperactivity disorder, unspecified type: Secondary | ICD-10-CM | POA: Insufficient documentation

## 2015-09-16 DIAGNOSIS — F419 Anxiety disorder, unspecified: Secondary | ICD-10-CM | POA: Diagnosis not present

## 2015-09-16 DIAGNOSIS — T50901A Poisoning by unspecified drugs, medicaments and biological substances, accidental (unintentional), initial encounter: Secondary | ICD-10-CM | POA: Diagnosis present

## 2015-09-16 DIAGNOSIS — F329 Major depressive disorder, single episode, unspecified: Secondary | ICD-10-CM | POA: Diagnosis not present

## 2015-09-16 DIAGNOSIS — T50902A Poisoning by unspecified drugs, medicaments and biological substances, intentional self-harm, initial encounter: Secondary | ICD-10-CM | POA: Diagnosis not present

## 2015-09-16 DIAGNOSIS — E876 Hypokalemia: Secondary | ICD-10-CM | POA: Insufficient documentation

## 2015-09-16 DIAGNOSIS — Z915 Personal history of self-harm: Secondary | ICD-10-CM | POA: Diagnosis not present

## 2015-09-16 DIAGNOSIS — Z87891 Personal history of nicotine dependence: Secondary | ICD-10-CM | POA: Insufficient documentation

## 2015-09-16 DIAGNOSIS — R4689 Other symptoms and signs involving appearance and behavior: Secondary | ICD-10-CM

## 2015-09-16 DIAGNOSIS — Y9289 Other specified places as the place of occurrence of the external cause: Secondary | ICD-10-CM | POA: Diagnosis not present

## 2015-09-16 DIAGNOSIS — R4589 Other symptoms and signs involving emotional state: Secondary | ICD-10-CM | POA: Diagnosis present

## 2015-09-16 LAB — CBC WITH DIFFERENTIAL/PLATELET
BASOS ABS: 0 10*3/uL (ref 0.0–0.1)
BASOS PCT: 0 %
EOS ABS: 0 10*3/uL (ref 0.0–0.7)
Eosinophils Relative: 0 %
HEMATOCRIT: 44.7 % (ref 36.0–46.0)
HEMOGLOBIN: 14.6 g/dL (ref 12.0–15.0)
LYMPHS ABS: 1.2 10*3/uL (ref 0.7–4.0)
Lymphocytes Relative: 15 %
MCH: 30.5 pg (ref 26.0–34.0)
MCHC: 32.7 g/dL (ref 30.0–36.0)
MCV: 93.5 fL (ref 78.0–100.0)
MONOS PCT: 3 %
Monocytes Absolute: 0.3 10*3/uL (ref 0.1–1.0)
NEUTROS PCT: 82 %
Neutro Abs: 6.8 10*3/uL (ref 1.7–7.7)
Platelets: 251 10*3/uL (ref 150–400)
RBC: 4.78 MIL/uL (ref 3.87–5.11)
RDW: 13.2 % (ref 11.5–15.5)
WBC: 8.2 10*3/uL (ref 4.0–10.5)

## 2015-09-16 LAB — COMPREHENSIVE METABOLIC PANEL
ALBUMIN: 3.9 g/dL (ref 3.5–5.0)
ALK PHOS: 74 U/L (ref 38–126)
ALT: 23 U/L (ref 14–54)
AST: 22 U/L (ref 15–41)
Anion gap: 7 (ref 5–15)
BILIRUBIN TOTAL: 0.7 mg/dL (ref 0.3–1.2)
BUN: 19 mg/dL (ref 6–20)
CALCIUM: 9 mg/dL (ref 8.9–10.3)
CO2: 25 mmol/L (ref 22–32)
CREATININE: 0.7 mg/dL (ref 0.44–1.00)
Chloride: 106 mmol/L (ref 101–111)
GFR calc Af Amer: >60 mL/min (ref >60)
GFR calc non Af Amer: >60 mL/min (ref >60)
GLUCOSE: 117 mg/dL — AB (ref 65–99)
Potassium: 4.4 mmol/L (ref 3.5–5.1)
SODIUM: 138 mmol/L (ref 135–145)
TOTAL PROTEIN: 6.9 g/dL (ref 6.5–8.1)

## 2015-09-16 LAB — URINALYSIS, ROUTINE W REFLEX MICROSCOPIC
Bilirubin Urine: NEGATIVE
Bilirubin Urine: NEGATIVE
GLUCOSE, UA: NEGATIVE mg/dL
GLUCOSE, UA: NEGATIVE mg/dL
HGB URINE DIPSTICK: NEGATIVE
Hgb urine dipstick: NEGATIVE
KETONES UR: NEGATIVE mg/dL
Ketones, ur: NEGATIVE mg/dL
LEUKOCYTES UA: NEGATIVE
LEUKOCYTES UA: NEGATIVE
Nitrite: NEGATIVE
Nitrite: NEGATIVE
PROTEIN: NEGATIVE mg/dL
PROTEIN: NEGATIVE mg/dL
Specific Gravity, Urine: 1.022 (ref 1.005–1.030)
Specific Gravity, Urine: 1.011 (ref 1.005–1.030)
pH: 5.5 (ref 5.0–8.0)
pH: 5.5 (ref 5.0–8.0)

## 2015-09-16 LAB — RAPID URINE DRUG SCREEN, HOSP PERFORMED
Amphetamines: POSITIVE — AB
BARBITURATES: NOT DETECTED
Benzodiazepines: POSITIVE — AB
COCAINE: NOT DETECTED
Opiates: NOT DETECTED
TETRAHYDROCANNABINOL: NOT DETECTED

## 2015-09-16 LAB — MRSA PCR SCREENING: MRSA BY PCR: NEGATIVE

## 2015-09-16 LAB — SALICYLATE LEVEL

## 2015-09-16 LAB — HCG, SERUM, QUALITATIVE: PREG SERUM: NEGATIVE

## 2015-09-16 LAB — CBG MONITORING, ED: GLUCOSE-CAPILLARY: 84 mg/dL (ref 65–99)

## 2015-09-16 LAB — ACETAMINOPHEN LEVEL: Acetaminophen (Tylenol), Serum: <10 ug/mL — ABNORMAL LOW (ref 10–30)

## 2015-09-16 LAB — ETHANOL: Alcohol, Ethyl (B): <5 mg/dL (ref <5)

## 2015-09-16 MED ORDER — ONDANSETRON HCL 4 MG/2ML IJ SOLN: 4.0000 mg | Freq: Four times a day (QID) | INTRAMUSCULAR | Status: DC | PRN

## 2015-09-16 MED ORDER — NALOXONE HCL 2 MG/2ML IJ SOSY: 1.0000 mg | PREFILLED_SYRINGE | Freq: Once | INTRAMUSCULAR | Status: DC

## 2015-09-16 MED ORDER — ONDANSETRON HCL 4 MG PO TABS: 4.0000 mg | ORAL_TABLET | Freq: Four times a day (QID) | ORAL | Status: DC | PRN

## 2015-09-16 MED ORDER — SODIUM CHLORIDE 0.9 % IV BOLUS (SEPSIS)
1000.0000 mL | Freq: Once | INTRAVENOUS | Status: AC
  Administered 2015-09-16: 1000 mL via INTRAVENOUS

## 2015-09-16 MED ORDER — NALOXONE HCL 0.4 MG/ML IJ SOLN
INTRAMUSCULAR | Status: AC
  Administered 2015-09-16: 0.4 mg
  Filled 2015-09-16: qty 1

## 2015-09-16 MED ORDER — SODIUM CHLORIDE 0.9 % IV SOLN
INTRAVENOUS | Status: DC
  Administered 2015-09-16: 16:00:00 via INTRAVENOUS
  Administered 2015-09-17: 1000 mL via INTRAVENOUS

## 2015-09-16 MED ORDER — ENOXAPARIN SODIUM 40 MG/0.4ML ~~LOC~~ SOLN
40.0000 mg | SUBCUTANEOUS | Status: DC
  Administered 2015-09-16 – 2015-09-17 (×2): 40 mg via SUBCUTANEOUS
  Filled 2015-09-16 (×3): qty 0.4

## 2015-09-16 NOTE — ED Notes (Signed)
Per EMS-patient found by husband this am-patient non-responsive-found pills underneath her and a bottle of Patron-husband states she had court this am-last normally was 1100/1130 pm yesterday

## 2015-09-16 NOTE — ED Notes (Signed)
Bed: WA14 Expected date:  Expected time:  Means of arrival:  Comments: EMS-OD 

## 2015-09-16 NOTE — ED Notes (Signed)
Gave home meds to husband

## 2015-09-16 NOTE — ED Notes (Signed)
Pt's husband to bedside and updated, sts py has attempted self harm in the past

## 2015-09-16 NOTE — ED Provider Notes (Signed)
CSN: [id number]     Arrival date & time 09/16/15  1018 History   First MD Initiated Contact with Patient 09/16/15 1027     Chief Complaint  Patient presents with  . Drug Overdose     (Consider location/radiation/quality/duration/timing/severity/associated sxs/prior Treatment) HPI  Level 5 caveat due to AMS. History provided by EMS. 50 year old female who presents with likely intentional drug overdose. Last seen normal at 11:30PM by husband. Court date today, and found with AMS next bag of medications by her husband around 9PM. Her son saw her at 7AM this morning and was normal. Takes klonopin, [place], and adderall, but unknown quantity. Normal glucose by EMS on their arrival.     Past Medical History  Diagnosis Date  . Anxiety   . ADHD (attention deficit hyperactivity disorder)   . Depression    Past Surgical History  Procedure Laterality Date  . Abdominal hysterectomy    . Tonsillectomy     Family History  Problem Relation Age of Onset  . Breast cancer Mother   . Hypertension Sister   . Alcohol abuse Maternal Grandfather   . Diabetes Maternal Grandmother   . Atrial fibrillation Maternal Grandmother    Social History  Substance Use Topics  . Smoking status: Former Smoker -- 1.00 packs/day    Types: Cigarettes    Start date: 02/18/1985    Quit date: 02/18/2013  . Smokeless tobacco: Never Used  . Alcohol Use: No   OB History    No data available     Review of Systems  Unable to perform ROS: Mental status change      Allergies  Extract of poison oak  Home Medications   Prior to Admission medications   Medication Sig Start Date End Date Taking? Authorizing Provider  ALPRAZolam (XANAX) 0.25 MG tablet Take 1 tablet (0.25 mg total) by mouth 3 (three) times daily as needed for anxiety. 06/26/15  Yes Kerin Salen, MD  amphetamine-dextroamphetamine (ADDERALL XR) 30 MG 24 hr capsule Take 1 capsule (30 mg total) by mouth daily. 06/26/15  Yes Kerin Salen, MD   zolpidem (AMBIEN) 10 MG tablet Take 1 tablet (10 mg total) by mouth at bedtime as needed for sleep. 06/26/15  Yes Kerin Salen, MD  valACYclovir (VALTREX) 1000 MG tablet take 2 tablets by mouth twice a day for ONE DAY TAKEN 12 HOURS APART 11/21/14   Historical Provider, MD   BP 112/57 mmHg  Pulse 66  Temp(Src) 97.6 F (36.4 C) (Oral)  Resp 15  Ht  (1.6 m)  Wt 167 lb 5.3 oz (75.9 kg)  BMI 29.65 kg/m2  SpO2 98%  LMP  Physical Exam Physical Exam  Nursing note and vitals reviewed. Constitutional: Disheveled, lethargic  Head: Normocephalic and atraumatic.  Mouth/Throat: Oropharynx is clear. Dry mucous membranes.  Neck: Neck supple.  Cardiovascular: Normal rate and regular rhythm.  No edema. Pulmonary/Chest: Effort normal and breath sounds normal.  Abdominal: Soft. There is no tenderness. There is no rebound and no guarding.  Musculoskeletal: No deformities. Neurological: Somnolent, arouses to sternal rub but only moans in incomprehensible words, withdraws to pain in all 4 extremities, gag reflex present Skin: Skin is cool to touch and dry.   ED Course  Procedures (including critical care time) Labs Review Labs Reviewed  COMPREHENSIVE METABOLIC PANEL - Abnormal; Notable for the following:    Glucose, Bld 117 (*)    All other components within normal limits  URINE RAPID DRUG SCREEN, HOSP PERFORMED -  Abnormal; Notable for the following:    Benzodiazepines POSITIVE (*)    Amphetamines POSITIVE (*)    All other components within normal limits  URINALYSIS, ROUTINE W REFLEX MICROSCOPIC (NOT AT [facility]) - Abnormal; Notable for the following:    APPearance CLOUDY (*)    All other components within normal limits  ACETAMINOPHEN LEVEL - Abnormal; Notable for the following:    Acetaminophen (Tylenol), Serum <10 (*)    All other components within normal limits  MRSA PCR SCREENING  URINE CULTURE  CBC WITH DIFFERENTIAL/PLATELET  ETHANOL  HCG, SERUM, QUALITATIVE  SALICYLATE LEVEL   URINALYSIS, ROUTINE W REFLEX MICROSCOPIC (NOT AT [facility])  TSH  BASIC METABOLIC PANEL  CBC  CBG MONITORING, ED    Imaging Review Ct Head Wo Contrast  09/16/2015  CLINICAL DATA:  Altered mental status.  Question drug overdose EXAM: CT HEAD WITHOUT CONTRAST TECHNIQUE: Contiguous axial images were obtained from the base of the skull through the vertex without intravenous contrast. COMPARISON:  April 11, 2008 FINDINGS: The ventricles are normal in size and configuration. There is no intracranial mass, hemorrhage, extra-axial fluid collection, or midline shift. Gray-white compartments are normal. No acute infarct evident. The bony calvarium appears intact. The mastoid air cells are clear. No intraorbital lesions are identified. IMPRESSION: Study within normal limits. Electronically Signed   By: Bretta Bang III M.D.   On: 09/16/2015 11:26   I have personally reviewed and evaluated these images and lab results as part of my medical decision-making.   EKG Interpretation   Date/Time:  Wednesday September 16 2015 10:19:05 EST Ventricular Rate:  79 PR Interval:  152 QRS Duration: 93 QT Interval:  392 QTC Calculation: 449 R Axis:   16 Text Interpretation:  Sinus rhythm Abnormal inferior Q waves No  significant change since last tracing Confirmed by LIU MD, DANA [phone]) on  09/16/2015 11:35:07 AM      CRITICAL CARE Performed by: Lavera Guise   Total critical care time: 35 minutes  Critical care time was exclusive of separately billable procedures and treating other patients.  Critical care was necessary to treat or prevent imminent or life-threatening deterioration.  Critical care was time spent personally by me on the following activities: development of treatment plan with patient and/or surrogate as well as nursing, discussions with consultants, evaluation of patient's response to treatment, examination of patient, obtaining history from patient or surrogate, ordering and performing  treatments and interventions, ordering and review of laboratory studies, ordering and review of radiographic studies, pulse oximetry and re-evaluation of patient's condition.   MDM   Final diagnoses:  Drug overdose, intentional self-harm, initial encounter [facility])    50 year old female who presents with AMS 2/2 intentional OD. Lethargic on arrival, arouses to sternal rub with incomprehensible moaning. Grossly neuro in tact. With gag reflex and felt to be protecting own airway. Was not responsive to trial of narcan. Repeat POC normal. Overall unremarkable tox screen and unremarkable blood work, aside from UDS positive for benzos and amphetamines. Is hypothermic and applied bair hugger. Hemodynamically stable. EKG without changes. Discussed with Dr. Izola Price who will admit for airway observation until medically cleared for psychiatric evaluation.    Lavera Guise, MD 09/16/15 1949

## 2015-09-16 NOTE — H&P (Signed)
[facility] History and Physical  Linda Hubbard ZOX:[id number] DOB: 1965/08/26 DOA: 09/16/2015  Referring physician: ED physician, Dr. Verdie Mosher  PCP: pt unable to provide  Chief Complaint: overdose   HPI:  Patient is 50 year old female with known history of depression and prior suicidal attempt, who presented to [facility] emergency department lethargic after apparent overdose on multiple medications. Please note that patient is currently unable to provide any history Hubbard to lethargy and her husband at bedside is not sure what patient took. He says patient takes Xanax and Ambien, Adderall and he is not sure if she took combination of all these medications or how much she took. Husband also unable to provide more details than this.  In emergency department, patient noted to be lethargic, difficult to arouse, cold to touch and with vital signs notable for hypothermia T 96.3 F. TRH asked to admit to step down unit for further evaluation and management.   Assessment and Plan: Active Problems:   Overdose, acute encephalopathy - Unclear how much patient took but clearly lethargic and very difficult to arouse - Agree with admission to step down unit - Keep under warming blankets and temperature stabilizes - High risk of aspiration and therefore keep on aspiration precautions, nothing by mouth until mental status more clear - Blood work looks unremarkable. We'll keep monitoring - Eventually will need psych evaluation once more medically stable    Suicidal behavior - Again, once more medically stable will need psych eval - Sitter at bedside    Hypothermia - Likely related to overdose - Keep under warming blankets for now  Lovenox SQ for DVT prophylaxis  Radiological Exams on Admission: Ct Head Wo Contrast 09/16/2015 Study within normal limits.    Code Status: Full Family Communication: Husband at bedside Disposition Plan: Admit for further evaluation    Danie Binder [facility] [phone]   Review of Systems:  Unable to obtain Hubbard to lethargy   Past Medical History  Diagnosis Date  . Anxiety   . ADHD (attention deficit hyperactivity disorder)   . Depression     Past Surgical History  Procedure Laterality Date  . Abdominal hysterectomy    . Tonsillectomy      Social History:  reports that she quit smoking about 2 years ago. Her smoking use included Cigarettes. She started smoking about 30 years ago. She smoked 1.00 pack per day. She has never used smokeless tobacco. She reports that she does not drink alcohol or use illicit drugs.  Allergies  Allergen Reactions  . Extract Of Poison Oak Swelling    Itching     Family History  Problem Relation Age of Onset  . Breast cancer Mother   . Hypertension Sister   . Alcohol abuse Maternal Grandfather   . Diabetes Maternal Grandmother   . Atrial fibrillation Maternal Grandmother     Prior to Admission medications   Medication Sig Start Date End Date Taking? Authorizing Provider  ALPRAZolam (XANAX) 0.25 MG tablet Take 1 tablet (0.25 mg total) by mouth 3 (three) times daily as needed for anxiety. 06/26/15  Yes Kerin Salen, MD  amphetamine-dextroamphetamine (ADDERALL XR) 30 MG 24 hr capsule Take 1 capsule (30 mg total) by mouth daily. 06/26/15  Yes Kerin Salen, MD  zolpidem (AMBIEN) 10 MG tablet Take 1 tablet (10 mg total) by mouth at bedtime as needed for sleep. 06/26/15  Yes Kerin Salen, MD  valACYclovir (VALTREX) 1000 MG tablet take 2 tablets by mouth twice a day  for ONE DAY TAKEN 12 HOURS APART 11/21/14   Historical Provider, MD    Physical Exam: Filed Vitals:   09/16/15 1245 09/16/15 1300 09/16/15 1315 09/16/15 1330  BP: 102/68 106/69 123/74 130/73  Pulse: 66 65 65 70  Temp:      TempSrc:      Resp: SpO2: 98% 99% 98% 100%    Physical Exam  Constitutional: Appears lethargic, disheveled, difficult to arouse HENT: External right and left ear normal. Dry mucous  membranes Eyes: PERRLA, no scleral icterus.  Neck: No JVD. No tracheal deviation. No thyromegaly.  CVS: RRR, S1/S2 +, no murmurs, no gallops, no carotid bruit.  Pulmonary: Poor inspiratory effort, diminished breath sounds at bases Abdominal: Soft. BS +,  no distension, tenderness, rebound or guarding.  Musculoskeletal: No edema and no tenderness.  Lymphadenopathy: No lymphadenopathy noted, cervical, inguinal. Neuro: Lethargic, currently not moving upper or lower extremities, barely opens eyes with sternal rub and nail bed pressure Skin: Cold and clammy  Psychiatric: Difficult to assess Hubbard to altered mental status  Labs on Admission:  Basic Metabolic Panel:  Recent Labs Lab 09/16/15 1030  NA 138  K 4.4  CL 106  CO2 25  GLUCOSE 117*  BUN 19  CREATININE 0.70  CALCIUM 9.0   Liver Function Tests:  Recent Labs Lab 09/16/15 1030  AST 22  ALT 23  ALKPHOS 74  BILITOT 0.7  PROT 6.9  ALBUMIN 3.9   CBC:  Recent Labs Lab 09/16/15 1030  WBC 8.2  NEUTROABS 6.8  HGB 14.6  HCT 44.7  MCV 93.5  PLT 251   CBG:  Recent Labs Lab 09/16/15 1039  GLUCAP 84    EKG: Pending   If 7PM-7AM, please contact night-coverage [email or web address] Password [facility] 09/16/2015, 2:24 PM

## 2015-09-17 DIAGNOSIS — T43222A Poisoning by selective serotonin reuptake inhibitors, intentional self-harm, initial encounter: Secondary | ICD-10-CM | POA: Diagnosis not present

## 2015-09-17 DIAGNOSIS — T424X2A Poisoning by benzodiazepines, intentional self-harm, initial encounter: Secondary | ICD-10-CM | POA: Diagnosis not present

## 2015-09-17 DIAGNOSIS — T50902A Poisoning by unspecified drugs, medicaments and biological substances, intentional self-harm, initial encounter: Secondary | ICD-10-CM | POA: Diagnosis not present

## 2015-09-17 DIAGNOSIS — T50901A Poisoning by unspecified drugs, medicaments and biological substances, accidental (unintentional), initial encounter: Secondary | ICD-10-CM | POA: Diagnosis not present

## 2015-09-17 DIAGNOSIS — T1491 Suicide attempt: Secondary | ICD-10-CM

## 2015-09-17 DIAGNOSIS — F489 Nonpsychotic mental disorder, unspecified: Secondary | ICD-10-CM | POA: Diagnosis not present

## 2015-09-17 DIAGNOSIS — T426X2A Poisoning by other antiepileptic and sedative-hypnotic drugs, intentional self-harm, initial encounter: Secondary | ICD-10-CM

## 2015-09-17 DIAGNOSIS — R45851 Suicidal ideations: Secondary | ICD-10-CM

## 2015-09-17 DIAGNOSIS — T68XXXA Hypothermia, initial encounter: Secondary | ICD-10-CM | POA: Diagnosis not present

## 2015-09-17 LAB — BASIC METABOLIC PANEL
ANION GAP: 6 (ref 5–15)
BUN: 14 mg/dL (ref 6–20)
CALCIUM: 7.8 mg/dL — AB (ref 8.9–10.3)
CO2: 22 mmol/L (ref 22–32)
Chloride: 113 mmol/L — ABNORMAL HIGH (ref 101–111)
Creatinine, Ser: 0.62 mg/dL (ref 0.44–1.00)
GLUCOSE: 80 mg/dL (ref 65–99)
Potassium: 3.3 mmol/L — ABNORMAL LOW (ref 3.5–5.1)
SODIUM: 141 mmol/L (ref 135–145)

## 2015-09-17 LAB — GLUCOSE, CAPILLARY
GLUCOSE-CAPILLARY: 68 mg/dL (ref 65–99)
GLUCOSE-CAPILLARY: 58 mg/dL — AB (ref 65–99)
GLUCOSE-CAPILLARY: 99 mg/dL (ref 65–99)

## 2015-09-17 LAB — CBC
HCT: 41.8 % (ref 36.0–46.0)
HEMOGLOBIN: 13.3 g/dL (ref 12.0–15.0)
MCH: 30.2 pg (ref 26.0–34.0)
MCHC: 31.8 g/dL (ref 30.0–36.0)
MCV: 95 fL (ref 78.0–100.0)
Platelets: 228 10*3/uL (ref 150–400)
RBC: 4.4 MIL/uL (ref 3.87–5.11)
RDW: 13.4 % (ref 11.5–15.5)
WBC: 9.3 10*3/uL (ref 4.0–10.5)

## 2015-09-17 LAB — TSH: TSH: 0.53 u[IU]/mL (ref 0.350–4.500)

## 2015-09-17 MED ORDER — POTASSIUM CHLORIDE CRYS ER 20 MEQ PO TBCR
40.0000 meq | EXTENDED_RELEASE_TABLET | Freq: Once | ORAL | Status: AC
  Administered 2015-09-17: 40 meq via ORAL
  Filled 2015-09-17: qty 2

## 2015-09-17 NOTE — Progress Notes (Signed)
Patient Demographics  Linda Hubbard, is a 50 y.o. female, DOB - 12/14/1965, MWU:[id number]  Admit date - 09/16/2015   Admitting Physician Dorothea Ogle, MD  Outpatient Primary MD for the patient is No primary care provider on file.  LOS - 1   Chief Complaint  Patient presents with  . Drug Overdose       Admission HPI/Brief narrative: 50 year old female with history of depression, and prior suicidal attempt, presents to [facility] long department secondary to lethargy from overdose on multiple medication in a suicide attempt, minute to stepdown unit for observation, status significantly improved this a.m., psychiatric consult pending.  Subjective:   Reneka Nebergall today has, No chest pain, No abdominal pain - No Nausea, no shortness of breath, no cough  Assessment & Plan    Principal Problem:   Overdose Active Problems:   Suicidal behavior   Hypothermia  Acute encephalopathy secondary to intentional overdose - CT head with no acute finding, acute encephalopathy secondary to intentional medication overdose(patient cannot recall which medication, she denies Adderall overdose) - Mental status continues to improve, back to baseline. - Passed bedside swallow evaluation, targeted on regular diet. - Will transfer to telemetry unit.  Suicidal behavior - Assaulted psychiatry  Hypothermia - Most likely related to overdose, resolved  Hypokalemia - Repleted  Code Status: Full  Family Communication: D/W patient  Disposition Plan: pending Psych evaluation.   Procedures  none   Consults   Psych Pending   Medications  Scheduled Meds: . enoxaparin (LOVENOX) injection  40 mg Subcutaneous Q24H  . potassium chloride  40 mEq Oral Once   Continuous Infusions: . sodium chloride 100 mL/hr at 09/16/15 1602   PRN Meds:.ondansetron **OR** ondansetron (ZOFRAN) IV  DVT Prophylaxis   Lovenox  Lab Results  Component Value Date   PLT 228 09/17/2015    Antibiotics    Anti-infectives    None          Objective:   Filed Vitals:   09/17/15 0500 09/17/15 0600 09/17/15 0700 09/17/15 0800  BP: 119/63 130/68 110/92 129/66  Pulse: 59 61 80 66  Temp:    97.9 F (36.6 C)  TempSrc:      Resp: Height:      Weight:      SpO2: 99% 99% 98% 98%    Wt Readings from Last 3 Encounters:  09/16/15 75.9 kg (167 lb 5.3 oz)  02/19/15 69.491 kg (153 lb 3.2 oz)     Intake/Output Summary (Last 24 hours) at 09/17/15 1050 Last data filed at 09/17/15 0900  Gross per 24 hour  Intake 1956.67 ml  Output    360 ml  Net 1596.67 ml     Physical Exam  Awake Alert, Oriented X 3, No new F.N deficits, Normal affect [place].AT,PERRAL Supple Neck,No JVD, No cervical lymphadenopathy appriciated.  Symmetrical Chest wall movement, Good air movement bilaterally, CTAB RRR,No Gallops,Rubs or new Murmurs, No Parasternal Heave +ve B.Sounds, Abd Soft, No tenderness, No organomegaly appriciated, No rebound - guarding or rigidity. No Cyanosis, Clubbing or edema, No new Rash or bruise    Data Review   Micro Results Recent Results (from the past 240 hour(s))  MRSA PCR Screening     Status:  None   Collection Time: 09/16/15  3:50 PM  Result Value Ref Range Status   MRSA by PCR NEGATIVE NEGATIVE Final    Comment:        The GeneXpert MRSA Assay (FDA approved for NASAL specimens only), is one component of a comprehensive MRSA colonization surveillance program. It is not intended to diagnose MRSA infection nor to guide or monitor treatment for MRSA infections.     Radiology Reports Ct Head Wo Contrast  09/16/2015  CLINICAL DATA:  Altered mental status.  Question drug overdose EXAM: CT HEAD WITHOUT CONTRAST TECHNIQUE: Contiguous axial images were obtained from the base of the skull through the vertex without intravenous contrast. COMPARISON:  April 11, 2008 FINDINGS:  The ventricles are normal in size and configuration. There is no intracranial mass, hemorrhage, extra-axial fluid collection, or midline shift. Gray-white compartments are normal. No acute infarct evident. The bony calvarium appears intact. The mastoid air cells are clear. No intraorbital lesions are identified. IMPRESSION: Study within normal limits. Electronically Signed   By: Bretta Bang III M.D.   On: 09/16/2015 11:26     CBC  Recent Labs Lab 09/16/15 1030 09/17/15 0317  WBC 8.2 9.3  HGB 14.6 13.3  HCT 44.7 41.8  PLT 251 228  MCV 93.5 95.0  MCH 30.5 30.2  MCHC 32.7 31.8  RDW 13.2 13.4  LYMPHSABS 1.2  --   MONOABS 0.3  --   EOSABS 0.0  --   BASOSABS 0.0  --     Chemistries   Recent Labs Lab 09/16/15 1030 09/17/15 0317  NA 138 141  K 4.4 3.3*  CL 106 113*  CO2 25 22  GLUCOSE 117* 80  BUN 19 14  CREATININE 0.70 0.62  CALCIUM 9.0 7.8*  AST 22  --   ALT 23  --   ALKPHOS 74  --   BILITOT 0.7  --    ------------------------------------------------------------------------------------------------------------------ estimated creatinine clearance is 83.9 mL/min (by C-G formula based on Cr of 0.62). ------------------------------------------------------------------------------------------------------------------ No results for input(s): HGBA1C in the last 72 hours. ------------------------------------------------------------------------------------------------------------------ No results for input(s): CHOL, HDL, LDLCALC, TRIG, CHOLHDL, LDLDIRECT in the last 72 hours. ------------------------------------------------------------------------------------------------------------------  Recent Labs  09/17/15 0317  TSH 0.530   ------------------------------------------------------------------------------------------------------------------ No results for input(s): VITAMINB12, FOLATE, FERRITIN, TIBC, IRON, RETICCTPCT in the last 72 hours.  Coagulation profile No  results for input(s): INR, PROTIME in the last 168 hours.  No results for input(s): DDIMER in the last 72 hours.  Cardiac Enzymes No results for input(s): CKMB, TROPONINI, MYOGLOBIN in the last 168 hours.  Invalid input(s): CK ------------------------------------------------------------------------------------------------------------------ Invalid input(s): POCBNP     Time Spent in minutes   25 minutes   ELGERGAWY, DAWOOD M.D on 09/17/2015 at 10:50 AM  Between 7am to 7pm - Pager - [phone]  After 7pm go to [email or web address] - password [facility]  [facility]   Office  [phone]

## 2015-09-17 NOTE — Progress Notes (Signed)
Pt transferred to 1521 from ICU. AO x 4. Pt belongings are locked up in plastic bins. Pt on suicide precautions. No questions or concerns from the pt at this time.  Samari Gorby W Amee Boothe, RN

## 2015-09-17 NOTE — Progress Notes (Signed)
Hypoglycemic Event  CBG: 58  Treatment: 15 GM carbohydrate snack: glass of orange juice and peanut butter with graham crackers  Symptoms: Pale and Hungry, drowsy  Follow-up CBG: Time:0831 CBG Result:99  Possible Reasons for Event: Inadequate meal intake. Pt had been NPO since admission due to altered mental status  Without CBG monitoring  Comments/MD notified: MD made aware of pt's CBG level and NPO status. MD added regular diet orders. Pt is lethargic but arouses easily, is A/O times four and able to protect airway during PO intake.    Nicholas Lose

## 2015-09-17 NOTE — Consult Note (Signed)
[facility] Face-to-Face Psychiatry Consult   Reason for Consult:  Intentional drug overdose as a suicide attempt Referring Physician:  Dr. Waldron Labs. Patient Identification: Linda Hubbard MRN:  [id number] Principal Diagnosis: Overdose Diagnosis:   Patient Active Problem List   Diagnosis Date Noted  . Overdose [T50.901A] 09/16/2015  . Suicidal behavior [F48.9] 09/16/2015  . Hypothermia [T68.XXXA] 09/16/2015    Total Time spent with patient: 1 hour  Subjective:   Linda Hubbard is a 50 y.o. female patient admitted with Intentional drug overdose as a suicide attempt.  HPI:  Linda Hubbard is 50 year old female seen, chart reviewed, case discussed with the patient and her husband who is at bedside for face-to-face psychiatric consultation and evaluation of increased symptoms of depression, anxiety and status post intentional drug overdose as a suicide attempt. Patient endorses suicidal attempt and stated reportedly taken 10 tablets of all medication including Ambien, alprazolam and Lexapro. Patient denied overdosing on stimulant medication Adderall. 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 Patient also reported that her psychiatric provider has left the hospital and she has made a decision not to continue medications and taper of and follow with the nonmedication therapies in near future without having any specific or consolidated plans. Patient to urine drug screen is positive for amphetamines and benzodiazepines. It is not clear who prescribed Lexapro and it is prescribed to her or not.  Patient was previously at [facility] and was treated as an outpatient for psychiatric medication management for attention  deficit hyperactivity disorder, depression and anxiety. Patient regrets about intentional overdose and does not agre with the recommendations need to be stabilized in psychiatric hospital and blames she was evaluated only 15 minutes) spent 45 minutes in her room obtaining history and evaluating patient and also discussed with her husband who is at bedside with the patient consent.  Past Psychiatric History: [facility] - out patient psych medication management. History of prior suicide attempt.  Risk to Self: Is patient at risk for suicide?: Yes Risk to Others:   Prior Inpatient Therapy:   Prior Outpatient Therapy:    Past Medical History:  Past Medical History  Diagnosis Date  . Anxiety   . ADHD (attention deficit hyperactivity disorder)   . Depression     Past Surgical History  Procedure Laterality Date  . Abdominal hysterectomy    . Tonsillectomy     Family History:  Family History  Problem Relation Age of Onset  . Breast cancer Mother   . Hypertension Sister   . Alcohol abuse Maternal Grandfather   . Diabetes Maternal Grandmother   . Atrial fibrillation Maternal Grandmother    Family Psychiatric  History: Significant for mood disorder Social History:  History  Alcohol Use No     History  Drug Use No    Social History   Social History  . Marital Status: Married    Spouse Name: N/A  . Number of Children: N/A  . Years of Education: N/A   Social History Main Topics  . Smoking status: Former Smoker -- 1.00 packs/day    Types: Cigarettes    Start date: 02/18/1985    Quit date: 02/18/2013  . Smokeless tobacco: Never Used  . Alcohol Use: No  .  Drug Use: No  . Sexual Activity: No   Other Topics Concern  . None   Social History Narrative   Additional Social History:    Allergies:   Allergies  Allergen Reactions  . Extract Of Poison Oak Swelling    Itching     Labs:  Results for orders placed or performed during the hospital encounter of 09/16/15 (from the  past 48 hour(s))  CBC with Differential     Status: None   Collection Time: 09/16/15 10:30 AM  Result Value Ref Range   WBC 8.2 4.0 - 10.5 K/uL   RBC 4.78 3.87 - 5.11 MIL/uL   Hemoglobin 14.6 12.0 - 15.0 g/dL   HCT 44.7 36.0 - 46.0 %   MCV 93.5 78.0 - 100.0 fL   MCH 30.5 26.0 - 34.0 pg   MCHC 32.7 30.0 - 36.0 g/dL   RDW 13.2 11.5 - 15.5 %   Platelets 251 150 - 400 K/uL   Neutrophils Relative % 82 %   Neutro Abs 6.8 1.7 - 7.7 K/uL   Lymphocytes Relative 15 %   Lymphs Abs 1.2 0.7 - 4.0 K/uL   Monocytes Relative 3 %   Monocytes Absolute 0.3 0.1 - 1.0 K/uL   Eosinophils Relative 0 %   Eosinophils Absolute 0.0 0.0 - 0.7 K/uL   Basophils Relative 0 %   Basophils Absolute 0.0 0.0 - 0.1 K/uL  Comprehensive metabolic panel     Status: Abnormal   Collection Time: 09/16/15 10:30 AM  Result Value Ref Range   Sodium 138 135 - 145 mmol/L   Potassium 4.4 3.5 - 5.1 mmol/L   Chloride 106 101 - 111 mmol/L   CO2 25 22 - 32 mmol/L   Glucose, Bld 117 (H) 65 - 99 mg/dL   BUN 19 6 - 20 mg/dL   Creatinine, Ser 0.70 0.44 - 1.00 mg/dL   Calcium 9.0 8.9 - 10.3 mg/dL   Total Protein 6.9 6.5 - 8.1 g/dL   Albumin 3.9 3.5 - 5.0 g/dL   AST 22 15 - 41 U/L   ALT 23 14 - 54 U/L   Alkaline Phosphatase 74 38 - 126 U/L   Total Bilirubin 0.7 0.3 - 1.2 mg/dL   GFR calc non Af Amer >60 >60 mL/min   GFR calc Af Amer >60 >60 mL/min    Comment: (NOTE) The eGFR has been calculated using the CKD EPI equation. This calculation has not been validated in all clinical situations. eGFR's persistently <60 mL/min signify possible Chronic Kidney Disease.    Anion gap 7 5 - 15  Ethanol     Status: None   Collection Time: 09/16/15 10:30 AM  Result Value Ref Range   Alcohol, Ethyl (B) <5 <5 mg/dL    Comment:        LOWEST DETECTABLE LIMIT FOR SERUM ALCOHOL IS 5 mg/dL FOR MEDICAL PURPOSES ONLY   hCG, serum, qualitative     Status: None   Collection Time: 09/16/15 10:30 AM  Result Value Ref Range   Preg, Serum  NEGATIVE NEGATIVE    Comment:        THE SENSITIVITY OF THIS METHODOLOGY IS >10 mIU/mL.   Acetaminophen level     Status: Abnormal   Collection Time: 09/16/15 10:30 AM  Result Value Ref Range   Acetaminophen (Tylenol), Serum <10 (L) 10 - 30 ug/mL    Comment:        THERAPEUTIC CONCENTRATIONS VARY SIGNIFICANTLY. A RANGE OF 10-30 ug/mL MAY BE AN  EFFECTIVE CONCENTRATION FOR MANY PATIENTS. HOWEVER, SOME ARE BEST TREATED AT CONCENTRATIONS OUTSIDE THIS RANGE. ACETAMINOPHEN CONCENTRATIONS >150 ug/mL AT 4 HOURS AFTER INGESTION AND >50 ug/mL AT 12 HOURS AFTER INGESTION ARE OFTEN ASSOCIATED WITH TOXIC REACTIONS.   Salicylate level     Status: None   Collection Time: 09/16/15 10:30 AM  Result Value Ref Range   Salicylate Lvl <9.5 2.8 - 30.0 mg/dL  Urine rapid drug screen (hosp performed)     Status: Abnormal   Collection Time: 09/16/15 10:35 AM  Result Value Ref Range   Opiates NONE DETECTED NONE DETECTED   Cocaine NONE DETECTED NONE DETECTED   Benzodiazepines POSITIVE (A) NONE DETECTED   Amphetamines POSITIVE (A) NONE DETECTED   Tetrahydrocannabinol NONE DETECTED NONE DETECTED   Barbiturates NONE DETECTED NONE DETECTED    Comment:        DRUG SCREEN FOR MEDICAL PURPOSES ONLY.  IF CONFIRMATION IS NEEDED FOR ANY PURPOSE, NOTIFY LAB WITHIN 5 DAYS.        LOWEST DETECTABLE LIMITS FOR URINE DRUG SCREEN Drug Class       Cutoff (ng/mL) Amphetamine      1000 Barbiturate      200 Benzodiazepine   284 Tricyclics       132 Opiates          300 Cocaine          300 THC              50   Urinalysis, Routine w reflex microscopic (not at [facility])     Status: Abnormal   Collection Time: 09/16/15 10:35 AM  Result Value Ref Range   Color, Urine YELLOW YELLOW   APPearance CLOUDY (A) CLEAR   Specific Gravity, Urine 1.022 1.005 - 1.030   pH 5.5 5.0 - 8.0   Glucose, UA NEGATIVE NEGATIVE mg/dL   Hgb urine dipstick NEGATIVE NEGATIVE   Bilirubin Urine NEGATIVE NEGATIVE   Ketones, ur  NEGATIVE NEGATIVE mg/dL   Protein, ur NEGATIVE NEGATIVE mg/dL   Nitrite NEGATIVE NEGATIVE   Leukocytes, UA NEGATIVE NEGATIVE    Comment: MICROSCOPIC NOT DONE ON URINES WITH NEGATIVE PROTEIN, BLOOD, LEUKOCYTES, NITRITE, OR GLUCOSE <1000 mg/dL.  POC CBG, ED     Status: None   Collection Time: 09/16/15 10:39 AM  Result Value Ref Range   Glucose-Capillary 84 65 - 99 mg/dL  Urinalysis, Routine w reflex microscopic (not at [facility])     Status: None   Collection Time: 09/16/15  3:50 PM  Result Value Ref Range   Color, Urine YELLOW YELLOW   APPearance CLEAR CLEAR   Specific Gravity, Urine 1.011 1.005 - 1.030   pH 5.5 5.0 - 8.0   Glucose, UA NEGATIVE NEGATIVE mg/dL   Hgb urine dipstick NEGATIVE NEGATIVE   Bilirubin Urine NEGATIVE NEGATIVE   Ketones, ur NEGATIVE NEGATIVE mg/dL   Protein, ur NEGATIVE NEGATIVE mg/dL   Nitrite NEGATIVE NEGATIVE   Leukocytes, UA NEGATIVE NEGATIVE    Comment: MICROSCOPIC NOT DONE ON URINES WITH NEGATIVE PROTEIN, BLOOD, LEUKOCYTES, NITRITE, OR GLUCOSE <1000 mg/dL.  MRSA PCR Screening     Status: None   Collection Time: 09/16/15  3:50 PM  Result Value Ref Range   MRSA by PCR NEGATIVE NEGATIVE    Comment:        The GeneXpert MRSA Assay (FDA approved for NASAL specimens only), is one component of a comprehensive MRSA colonization surveillance program. It is not intended to diagnose MRSA infection nor to guide or monitor treatment for MRSA  infections.   TSH     Status: None   Collection Time: 09/17/15  3:17 AM  Result Value Ref Range   TSH 0.530 0.350 - 4.500 uIU/mL  Basic metabolic panel     Status: Abnormal   Collection Time: 09/17/15  3:17 AM  Result Value Ref Range   Sodium 141 135 - 145 mmol/L   Potassium 3.3 (L) 3.5 - 5.1 mmol/L    Comment: DELTA CHECK NOTED REPEATED TO VERIFY    Chloride 113 (H) 101 - 111 mmol/L   CO2 22 22 - 32 mmol/L   Glucose, Bld 80 65 - 99 mg/dL   BUN 14 6 - 20 mg/dL   Creatinine, Ser 0.62 0.44 - 1.00 mg/dL   Calcium 7.8  (L) 8.9 - 10.3 mg/dL   GFR calc non Af Amer >60 >60 mL/min   GFR calc Af Amer >60 >60 mL/min    Comment: (NOTE) The eGFR has been calculated using the CKD EPI equation. This calculation has not been validated in all clinical situations. eGFR's persistently <60 mL/min signify possible Chronic Kidney Disease.    Anion gap 6 5 - 15  CBC     Status: None   Collection Time: 09/17/15  3:17 AM  Result Value Ref Range   WBC 9.3 4.0 - 10.5 K/uL   RBC 4.40 3.87 - 5.11 MIL/uL   Hemoglobin 13.3 12.0 - 15.0 g/dL   HCT 41.8 36.0 - 46.0 %   MCV 95.0 78.0 - 100.0 fL   MCH 30.2 26.0 - 34.0 pg   MCHC 31.8 30.0 - 36.0 g/dL   RDW 13.4 11.5 - 15.5 %   Platelets 228 150 - 400 K/uL  Glucose, capillary     Status: None   Collection Time: 09/17/15  4:48 AM  Result Value Ref Range   Glucose-Capillary 68 65 - 99 mg/dL  Glucose, capillary     Status: Abnormal   Collection Time: 09/17/15  7:38 AM  Result Value Ref Range   Glucose-Capillary 58 (L) 65 - 99 mg/dL  Glucose, capillary     Status: None   Collection Time: 09/17/15  8:31 AM  Result Value Ref Range   Glucose-Capillary 99 65 - 99 mg/dL    Current Facility-Administered Medications  Medication Dose Route Frequency Provider Last Rate Last Dose  . 0.9 %  sodium chloride infusion   Intravenous Continuous Theodis Blaze, MD 100 mL/hr at 09/16/15 1602    . enoxaparin (LOVENOX) injection 40 mg  40 mg Subcutaneous Q24H Theodis Blaze, MD   40 mg at 09/16/15 1702  . ondansetron (ZOFRAN) tablet 4 mg  4 mg Oral Q6H PRN Theodis Blaze, MD       Or  . ondansetron [facility]) injection 4 mg  4 mg Intravenous Q6H PRN Theodis Blaze, MD        Musculoskeletal: Strength & Muscle Tone: decreased Gait & Station: unable to stand Patient leans: N/A  Psychiatric Specialty Exam: ROS depression, anxiety, multiple psychosocial stresses and diagnosis status post intentional overdose of multiple medication when life. Patient continued to be emotionally unstable during my  evaluation   Blood pressure 129/66, pulse 66, temperature 97.9 F (36.6 C), temperature source Oral, resp. rate 14, height '5\' 3"'  (1.6 m), weight 75.9 kg (167 lb 5.3 oz), SpO2 98 %.Body mass index is 29.65 kg/(m^2).  General Appearance: Guarded  Eye Contact::  Good  Speech:  Clear and Coherent and Pressured  Volume:  Normal  Mood:  Anxious,  Depressed, Hopeless, Irritable and Worthless  Affect:  Depressed, Labile and Tearful  Thought Process:  Coherent and Goal Directed  Orientation:  Full (Time, Place, and Person)  Thought Content:  WDL  Suicidal Thoughts:  Yes.  with intent/plan  Homicidal Thoughts:  No  Memory:  Immediate;   Fair Recent;   Fair  Judgement:  Impaired  Insight:  Fair  Psychomotor Activity:  Decreased  Concentration:  Fair  Recall:  [employer] of Knowledge:Good  Language: Good  Akathisia:  Negative  Handed:  Right  AIMS (if indicated):     Assets:  Communication Skills Desire for Improvement Financial Resources/Insurance Housing Leisure Time Resilience Social Support Transportation  ADL's:  Intact  Cognition: WNL  Sleep:      Treatment Plan Summary: Daily contact with patient to assess and evaluate symptoms and progress in treatment and Medication management   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  Referred to the psychiatric social service for possible IVC commitment.   Disposition: Recommend psychiatric Inpatient admission when medically cleared. Supportive therapy provided about ongoing stressors.  Durward Parcel., MD 09/17/2015 10:18 AM

## 2015-09-18 ENCOUNTER — Inpatient Hospital Stay (HOSPITAL_COMMUNITY)
Admission: AD | Admit: 2015-09-18 | Discharge: 2015-09-21 | DRG: 881 | Disposition: A | Discharge status: Home / Self Care | Payer: 59 | Source: Intra-hospital | Attending: Psychiatry | Admitting: Psychiatry

## 2015-09-18 ENCOUNTER — Encounter (HOSPITAL_COMMUNITY): Payer: Self-pay | Admitting: General Practice

## 2015-09-18 DIAGNOSIS — T426X2A Poisoning by other antiepileptic and sedative-hypnotic drugs, intentional self-harm, initial encounter: Secondary | ICD-10-CM | POA: Diagnosis not present

## 2015-09-18 DIAGNOSIS — T68XXXA Hypothermia, initial encounter: Secondary | ICD-10-CM | POA: Diagnosis not present

## 2015-09-18 DIAGNOSIS — T424X2A Poisoning by benzodiazepines, intentional self-harm, initial encounter: Secondary | ICD-10-CM | POA: Diagnosis not present

## 2015-09-18 DIAGNOSIS — T50901A Poisoning by unspecified drugs, medicaments and biological substances, accidental (unintentional), initial encounter: Secondary | ICD-10-CM

## 2015-09-18 DIAGNOSIS — Z87891 Personal history of nicotine dependence: Secondary | ICD-10-CM

## 2015-09-18 DIAGNOSIS — F489 Nonpsychotic mental disorder, unspecified: Secondary | ICD-10-CM | POA: Diagnosis not present

## 2015-09-18 DIAGNOSIS — F329 Major depressive disorder, single episode, unspecified: Principal | ICD-10-CM | POA: Diagnosis present

## 2015-09-18 DIAGNOSIS — T50902A Poisoning by unspecified drugs, medicaments and biological substances, intentional self-harm, initial encounter: Secondary | ICD-10-CM | POA: Diagnosis not present

## 2015-09-18 DIAGNOSIS — T1491 Suicide attempt: Secondary | ICD-10-CM | POA: Diagnosis not present

## 2015-09-18 DIAGNOSIS — T5192XA Toxic effect of unspecified alcohol, intentional self-harm, initial encounter: Secondary | ICD-10-CM | POA: Diagnosis not present

## 2015-09-18 LAB — URINE CULTURE

## 2015-09-18 MED ORDER — ALUM & MAG HYDROXIDE-SIMETH 200-200-20 MG/5ML PO SUSP: 30.0000 mL | ORAL | Status: DC | PRN

## 2015-09-18 MED ORDER — LIP MEDEX EX OINT
TOPICAL_OINTMENT | CUTANEOUS | Status: AC
  Filled 2015-09-18: qty 7

## 2015-09-18 MED ORDER — ACETAMINOPHEN 325 MG PO TABS: 650.0000 mg | ORAL_TABLET | Freq: Four times a day (QID) | ORAL | Status: DC | PRN

## 2015-09-18 MED ORDER — MAGNESIUM HYDROXIDE 400 MG/5ML PO SUSP: 30.0000 mL | Freq: Every day | ORAL | Status: DC | PRN

## 2015-09-18 MED ORDER — HYDROXYZINE HCL 25 MG PO TABS
25.0000 mg | ORAL_TABLET | Freq: Four times a day (QID) | ORAL | Status: DC | PRN
  Administered 2015-09-18 – 2015-09-19 (×2): 25 mg via ORAL
  Filled 2015-09-18 (×2): qty 1

## 2015-09-18 MED ORDER — ALPRAZOLAM 0.25 MG PO TABS: 0.2500 mg | ORAL_TABLET | Freq: Three times a day (TID) | ORAL | Status: DC | PRN

## 2015-09-18 NOTE — Progress Notes (Signed)
CSW received confirmation that [facility].   RN notified and calling report to [facility].  CSW arranged transportation via [employer].   CSW discussed with pt at bedside who remains agreeable to go voluntary to [facility].   No further social work needs identified.   CSW signing off.   Loletta SpecterSuzanna Portia Hubbard, MSW, LCSW Clinical Social Work Coverage [phone]

## 2015-09-18 NOTE — Discharge Instructions (Signed)
Follow with Primary MD after discharge from [facility]  Get CBC, CMP,checked  by Primary MD next visit.    Activity: As tolerated with Full fall precautions use walker/cane & assistance as needed   Disposition Home    Diet: Regular diet  , with feeding assistance and aspiration precautions.   On your next visit with your primary care physician please Get Medicines reviewed and adjusted.   Please request your Prim.MD to go over all Hospital Tests and Procedure/Radiological results at the follow up, please get all Hospital records sent to your Prim MD by signing hospital release before you go home.   If you experience worsening of your admission symptoms, develop shortness of breath, life threatening emergency, suicidal or homicidal thoughts you must seek medical attention immediately by calling 911 or calling your MD immediately  if symptoms less severe.  You Must read complete instructions/literature along with all the possible adverse reactions/side effects for all the Medicines you take and that have been prescribed to you. Take any new Medicines after you have completely understood and accpet all the possible adverse reactions/side effects.   Do not drive, operating heavy machinery, perform activities at heights, swimming or participation in water activities or provide baby sitting services if your were admitted for syncope or siezures until you have seen by Primary MD or a Neurologist and advised to do so again.  Do not drive when taking Pain medications.    Do not take more than prescribed Pain, Sleep and Anxiety Medications  Special Instructions: If you have smoked or chewed Tobacco  in the last 2 yrs please stop smoking, stop any regular Alcohol  and or any Recreational drug use.  Wear Seat belts while driving.   Please note  You were cared for by a hospitalist during your hospital stay. If you have any questions about your discharge medications or the care you received while  you were in the hospital after you are discharged, you can call the unit and asked to speak with the hospitalist on call if the hospitalist that took care of you is not available. Once you are discharged, your primary care physician will handle any further medical issues. Please note that NO REFILLS for any discharge medications will be authorized once you are discharged, as it is imperative that you return to your primary care physician (or establish a relationship with a primary care physician if you do not have one) for your aftercare needs so that they can reassess your need for medications and monitor your lab values.

## 2015-09-18 NOTE — Discharge Summary (Signed)
Linda Hubbard, is a 50 y.o. female  DOB 02-25-1966  MRN [id number].  Admission date:  09/16/2015  Admitting Physician  Dorothea OgleIskra M Myers, MD  Discharge Date:  09/18/2015   Primary MD  No primary care provider on file.  Recommendations for primary care physician for things to follow:  - [facility]  Admission Diagnosis  Drug overdose, intentional self-harm, initial encounter [facility](HCC) [T50.902A]   Discharge Diagnosis  Drug overdose, intentional self-harm, initial encounter (HCC) [T50.902A]   Principal Problem:   Overdose Active Problems:   Suicidal behavior   Hypothermia      Past Medical History  Diagnosis Date  . Anxiety   . ADHD (attention deficit hyperactivity disorder)   . Depression     Past Surgical History  Procedure Laterality Date  . Abdominal hysterectomy    . Tonsillectomy         History of present illness and  Hospital Course:     Kindly see H&P for history of present illness and admission details, please review complete Labs, Consult reports and Test reports for all details in brief  HPI  from the history and physical done on the day of admission 09/16/2015 Patient is 50 year old female with known history of depression and prior suicidal attempt, who [facility] lethargic after apparent overdose on multiple medications. Please note that patient is currently unable to provide any history due to lethargy and her husband at bedside is not sure what patient took. He says patient takes Xanax and Ambien, Adderall and he is not sure if she took combination of all these medications or how much she took. Husband also unable to provide more details than this.  In emergency department, patient noted to be lethargic, difficult to arouse, cold to touch and with vital signs notable for hypothermia T 96.3 F. TR[facility] asked to admit to step down unit for  further evaluation and management.   Hospital Course  50 year old female with history of depression, and prior suicidal attempt, [facility] secondary to lethargy from overdose on multiple medication in a suicide attempt, admitted to stepdown unit for observation, mental status significantly improved , back to baseline, psychiatry consulted, where patient will need inpatient psych admission, but available [facility].  Acute encephalopathy secondary to intentional overdose - CT head with no acute finding, acute encephalopathy secondary to intentional medication overdose(patient cannot recall which medication, she denies Adderall overdose) - Mental status improved, back to baseline. - Passed bedside swallow evaluation, with a regular diet - need inpatient psychiatric admission per Psych, patient is medically stable for inpatient psychiatric admission.  Suicidal behavior - Psychiatry consult appreciated, [facility]  Hypothermia - Most likely related to overdose, resolved  Hypokalemia - Repleted   Discharge Condition:  Stable      Discharge Instructions  and  Discharge Medications     Discharge Instructions    Discharge instructions    Complete by:  As directed   Follow  with Primary MD after discharge from [facility]  Get CBC, CMP,checked  by Primary MD next visit.    Activity: As tolerated with Full fall precautions use walker/cane & assistance as needed   Disposition Home    Diet: Regular diet  , with feeding assistance and aspiration precautions.   On your next visit with your primary care physician please Get Medicines reviewed and adjusted.   Please request your Prim.MD to go over all Hospital Tests and Procedure/Radiological results at the follow up, please get all Hospital records sent to your Prim MD by signing hospital release before you go home.   If you experience worsening of your admission symptoms, develop  shortness of breath, life threatening emergency, suicidal or homicidal thoughts you must seek medical attention immediately by calling 911 or calling your MD immediately  if symptoms less severe.  You Must read complete instructions/literature along with all the possible adverse reactions/side effects for all the Medicines you take and that have been prescribed to you. Take any new Medicines after you have completely understood and accpet all the possible adverse reactions/side effects.   Do not drive, operating heavy machinery, perform activities at heights, swimming or participation in water activities or provide baby sitting services if your were admitted for syncope or siezures until you have seen by Primary MD or a Neurologist and advised to do so again.  Do not drive when taking Pain medications.    Do not take more than prescribed Pain, Sleep and Anxiety Medications  Special Instructions: If you have smoked or chewed Tobacco  in the last 2 yrs please stop smoking, stop any regular Alcohol  and or any Recreational drug use.  Wear Seat belts while driving.   Please note  You were cared for by a hospitalist during your hospital stay. If you have any questions about your discharge medications or the care you received while you were in the hospital after you are discharged, you can call the unit and asked to speak with the hospitalist on call if the hospitalist that took care of you is not available. Once you are discharged, your primary care physician will handle any further medical issues. Please note that NO REFILLS for any discharge medications will be authorized once you are discharged, as it is imperative that you return to your primary care physician (or establish a relationship with a primary care physician if you do not have one) for your aftercare needs so that they can reassess your need for medications and monitor your lab values.            Medication List    STOP taking  these medications        amphetamine-dextroamphetamine 30 MG 24 hr capsule  Commonly known as:  ADDERALL XR     valACYclovir 1000 MG tablet  Commonly known as:  VALTREX     zolpidem 10 MG tablet  Commonly known as:  AMBIEN      TAKE these medications        ALPRAZolam 0.25 MG tablet  Commonly known as:  XANAX  Take 1 tablet (0.25 mg total) by mouth 3 (three) times daily as needed for anxiety.          Diet and Activity recommendation: See Discharge Instructions above  Consults obtained -  Psychiatry   Major procedures and Radiology Reports - PLEASE review detailed and final reports for all details, in brief -      Ct Head Wo Contrast  09/16/2015  CLINICAL DATA:  Altered mental status.  Question drug overdose EXAM: CT HEAD WITHOUT CONTRAST TECHNIQUE: Contiguous axial images were obtained from the base of the skull through the vertex without intravenous contrast. COMPARISON:  April 11, 2008 FINDINGS: The ventricles are normal in size and configuration. There is no intracranial mass, hemorrhage, extra-axial fluid collection, or midline shift. Gray-white compartments are normal. No acute infarct evident. The bony calvarium appears intact. The mastoid air cells are clear. No intraorbital lesions are identified. IMPRESSION: Study within normal limits. Electronically Signed   By: Bretta Bang III M.D.   On: 09/16/2015 11:26    Micro Results     Recent Results (from the past 240 hour(s))  Urine culture     Status: None   Collection Time: 09/16/15  3:50 PM  Result Value Ref Range Status   Specimen Description URINE, CLEAN CATCH  Final   Special Requests NONE  Final   Culture   Final    MULTIPLE SPECIES PRESENT, SUGGEST RECOLLECTION Performed at [facility]    Report Status 09/18/2015 FINAL  Final  MRSA PCR Screening     Status: None   Collection Time: 09/16/15  3:50 PM  Result Value Ref Range Status   MRSA by PCR NEGATIVE NEGATIVE Final    Comment:         The GeneXpert MRSA Assay (FDA approved for NASAL specimens only), is one component of a comprehensive MRSA colonization surveillance program. It is not intended to diagnose MRSA infection nor to guide or monitor treatment for MRSA infections.        Today   Subjective:   Linda Hubbard today has no headache,no chest or abdominal pain, no dyspnea or cough.  Objective:   Blood pressure 126/89, pulse 58, temperature 97.8 F (36.6 C), temperature source Oral, resp. rate 16, height  (1.6 m), weight 75.9 kg (167 lb 5.3 oz), SpO2 98 %.   Intake/Output Summary (Last 24 hours) at 09/18/15 1056 Last data filed at 09/18/15 0900  Gross per 24 hour  Intake   2515 ml  Output      0 ml  Net   2515 ml    Exam Awake Alert, Oriented x 3, No new F.N deficits,  [place].AT,PERRAL Supple Neck,No JVD, No cervical lymphadenopathy appriciated.  Symmetrical Chest wall movement, Good air movement bilaterally, CTAB RRR,No Gallops,Rubs or new Murmurs, No Parasternal Heave +ve B.Sounds, Abd Soft, Non tender, No organomegaly appriciated, No rebound -guarding or rigidity. No Cyanosis, Clubbing or edema, No new Rash or bruise  Data Review   CBC w Diff: Lab Results  Component Value Date   WBC 9.3 09/17/2015   HGB 13.3 09/17/2015   HCT 41.8 09/17/2015   PLT 228 09/17/2015   LYMPHOPCT 15 09/16/2015   MONOPCT 3 09/16/2015   EOSPCT 0 09/16/2015   BASOPCT 0 09/16/2015    CMP: Lab Results  Component Value Date   NA 141 09/17/2015   K 3.3* 09/17/2015   CL 113* 09/17/2015   CO2 22 09/17/2015   BUN 14 09/17/2015   CREATININE 0.62 09/17/2015   PROT 6.9 09/16/2015   ALBUMIN 3.9 09/16/2015   BILITOT 0.7 09/16/2015   ALKPHOS 74 09/16/2015   AST 22 09/16/2015   ALT 23 09/16/2015  .   Total Time in preparing paper work, data evaluation and todays exam - 35 minutes  Shareece Bultman M.D on 09/18/2015 at 10:56 AM  [facility]   Office  [phone]

## 2015-09-18 NOTE — Progress Notes (Signed)
Patient ID: Linda Hubbard, female   DOB: 08/20/1965, 50 y.o.   MRN: [id number]  [name] whom likes to be called Aurther Lofterry is a 50 year old caucasian female [facility] after an OD on her medications at home. Patient reports she was overwhelmed with her DUI that she received in 2014 she reports is because she had taken Adderall, "which I had a prescription for." She goes on to state with tears in her eyes, "it's all bullshit." She appears anxious and tearful when discussing this issue. Stating that the court date is delayed every time she is about to go to court. She denies alcohol or drug abuse. She reports, "It was stupid." She denies SI/HI/A/V hallucinations. She denies any pain but does report discomfort from her ring that is on her finger. Patient's finger does appear swollen. The ring was safely removed from patient's finger with floss. Patient's ring was then locked in her locker. She was given an ice pack to help with the swelling. She reports the swelling is related to the fluid she received in the ED. She states that she has had panic attacks since receiving the DUI and began taking xanax 0.25 mg as prescribed. She reports decreased sleep, anxiety, depression, and marital conflict surrounding this. 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 She was oriented to the unit and verbalized understanding of the admission process. Q15 minute safety checks initiated and are maintained.

## 2015-09-18 NOTE — Progress Notes (Signed)
CSW received notification that pt needing inpatient psych admission.   CSW received notification that pt has bed at [facility] today.  CSW confirmed pt medically stable with MD and notified MD of bed available.  CSW met with pt at bedside. CSW notified pt that bed available at [facility]. Pt expressed understanding and agreeable to go to [place] voluntarily.   Voluntary Consent for Admission and Treatment signed witnessed by this CSW and faxed to [facility].   CSW to await notification that bed is ready at [facility] to arrange [employer].  CSW to continue to follow.  Alison Murray, MSW, LCSW Clinical Social Work Psych [place] coverage [phone]

## 2015-09-18 NOTE — Tx Team (Signed)
Initial Interdisciplinary Treatment Plan   PATIENT STRESSORS: Financial difficulties Legal issue Marital or family conflict   PATIENT STRENGTHS: Average or above average intelligence Communication skills General fund of knowledge Motivation for treatment/growth Supportive family/friends   PROBLEM LIST: Problem List/Patient Goals Date to be addressed Date deferred Reason deferred Estimated date of resolution  Risk for suicide 09/18/15           Depression 09/18/15           Legal Issue- DUI charge 09/18/15           "work on sleep" 09/18/15           "coping skills" 09/18/15      DISCHARGE CRITERIA:  Improved stabilization in mood, thinking, and/or behavior Verbal commitment to aftercare and medication compliance  PRELIMINARY DISCHARGE PLAN: Outpatient therapy Return to previous living arrangement  PATIENT/FAMIILY INVOLVEMENT: This treatment plan has been presented to and reviewed with the patient, Blanche Easteresa L Pitney.  The patient and family have been given the opportunity to ask questions and make suggestions.  Leylanie Woodmansee E 09/18/2015, 6:00 PM

## 2015-09-18 NOTE — Progress Notes (Signed)
Adult Psychoeducational Group Note  Date:  09/18/2015 Time:  9:08 PM  Group Topic/Focus:  Wrap-Up Group:   The focus of this group is to help patients review their daily goal of treatment and discuss progress on daily workbooks.  Participation Level:  Active  Participation Quality:  Appropriate  Affect:  Appropriate  Cognitive:  Appropriate  Insight: Appropriate  Engagement in Group:  Engaged  Modes of Intervention:  Problem-solving  Additional Comments:  [name] stated how full of uncertainty and looking forward of what's going on.  She stated today was not bad or good either.    Annell GreeningMonroe, Kendyl Festa Durbinasina 09/18/2015, 9:08 PM

## 2015-09-19 ENCOUNTER — Encounter (HOSPITAL_COMMUNITY): Payer: Self-pay | Admitting: Psychiatry

## 2015-09-19 DIAGNOSIS — T1491 Suicide attempt: Secondary | ICD-10-CM

## 2015-09-19 DIAGNOSIS — T426X2A Poisoning by other antiepileptic and sedative-hypnotic drugs, intentional self-harm, initial encounter: Secondary | ICD-10-CM

## 2015-09-19 DIAGNOSIS — T424X2A Poisoning by benzodiazepines, intentional self-harm, initial encounter: Secondary | ICD-10-CM

## 2015-09-19 LAB — BASIC METABOLIC PANEL
ANION GAP: 9 (ref 5–15)
BUN: 10 mg/dL (ref 6–20)
CALCIUM: 8.9 mg/dL (ref 8.9–10.3)
CO2: 26 mmol/L (ref 22–32)
Chloride: 107 mmol/L (ref 101–111)
Creatinine, Ser: 0.55 mg/dL (ref 0.44–1.00)
GLUCOSE: 88 mg/dL (ref 65–99)
Potassium: 3.6 mmol/L (ref 3.5–5.1)
Sodium: 142 mmol/L (ref 135–145)

## 2015-09-19 MED ORDER — ONDANSETRON 4 MG PO TBDP: 4.0000 mg | ORAL_TABLET | Freq: Four times a day (QID) | ORAL | Status: DC | PRN

## 2015-09-19 MED ORDER — ADULT MULTIVITAMIN W/MINERALS CH
1.0000 | ORAL_TABLET | Freq: Every day | ORAL | Status: DC
  Administered 2015-09-19 – 2015-09-21 (×3): 1 via ORAL
  Filled 2015-09-19 (×6): qty 1

## 2015-09-19 MED ORDER — VITAMIN B-1 100 MG PO TABS
100.0000 mg | ORAL_TABLET | Freq: Every day | ORAL | Status: DC
  Administered 2015-09-20 – 2015-09-21 (×2): 100 mg via ORAL
  Filled 2015-09-19 (×3): qty 1

## 2015-09-19 MED ORDER — IBUPROFEN 600 MG PO TABS
600.0000 mg | ORAL_TABLET | Freq: Four times a day (QID) | ORAL | Status: DC | PRN
  Administered 2015-09-19: 600 mg via ORAL
  Filled 2015-09-19: qty 1

## 2015-09-19 MED ORDER — LOPERAMIDE HCL 2 MG PO CAPS: 2.0000 mg | ORAL_CAPSULE | ORAL | Status: DC | PRN

## 2015-09-19 MED ORDER — FLUOXETINE HCL 20 MG PO CAPS
20.0000 mg | ORAL_CAPSULE | Freq: Every day | ORAL | Status: DC
  Administered 2015-09-19 – 2015-09-21 (×3): 20 mg via ORAL
  Filled 2015-09-19 (×4): qty 1

## 2015-09-19 MED ORDER — LORAZEPAM 1 MG PO TABS: 1.0000 mg | ORAL_TABLET | Freq: Four times a day (QID) | ORAL | Status: DC | PRN

## 2015-09-19 MED ORDER — THIAMINE HCL 100 MG/ML IJ SOLN
100.0000 mg | Freq: Once | INTRAMUSCULAR | Status: AC
  Administered 2015-09-19: 100 mg via INTRAMUSCULAR
  Filled 2015-09-19: qty 2

## 2015-09-19 MED ORDER — HYDROXYZINE HCL 25 MG PO TABS: 25.0000 mg | ORAL_TABLET | Freq: Four times a day (QID) | ORAL | Status: DC | PRN
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
## 2015-09-19 NOTE — Progress Notes (Signed)
D: Pt has anxious affect and mood.  She was in her room visiting with her husband and her son upon initial approach.  She reports she had a good visit with them and that her day was "good, it's so nice to be out of that small room at the ED."  Pt denies SI/HI, denies hallucinations, denies pain.  Pt has been visible in milieu interacting with peers and staff appropriately.  Pt attended evening group.   A: Introduced self to pt.  Support, reassurance and encouragement offered.  Actively listened to pt.  PRN medication administered for anxiety. R: Pt is compliant with medications.  Pt verbally contracts for safety and reports that she will inform staff of needs and concerns.  Will continue to monitor and assess.

## 2015-09-19 NOTE — Progress Notes (Signed)
D-  Patient has talked about having a goal of being able to appreciate the things that she has in life.  Patient stated "I have lost sight of all the positives in my life."  Patient attended groups, was compliant with medications and worked on goal setting.   A- Assess patient for safety offer medications as prescribed,   R-  Patient able to contract for safety.

## 2015-09-19 NOTE — Plan of Care (Signed)
Problem: Alteration in mood Goal: LTG-Patient reports reduction in suicidal thoughts (Patient reports reduction in suicidal thoughts and is able to verbalize a safety plan for whenever patient is feeling suicidal)  Outcome: Progressing Pt denies SI and verbally contracts for safety.       

## 2015-09-19 NOTE — Progress Notes (Signed)
Adult Psychoeducational Group Note  Date:  09/19/2015 Time:  9:22 PM  Group Topic/Focus:  Wrap-Up Group:   The focus of this group is to help patients review their daily goal of treatment and discuss progress on daily workbooks.  Participation Level:  Minimal  Participation Quality:  Appropriate and Attentive  Affect:  Appropriate and Flat  Cognitive:  Appropriate  Insight: Appropriate and Good  Engagement in Group:  Engaged  Modes of Intervention:  Discussion  Additional Comments:  Pts were asked if they were able to accomplish their goal for the day and what they wanted to work on while they are here. Pt stated her goal was to not isolate, which she accomplished. Pt stated she met lots of people, all coming from different backgrounds. Pt stated overall she had a good day.  Clint Bolder 09/19/2015, 9:22 PM

## 2015-09-19 NOTE — [facility] Suicide Risk Assessment (Signed)
[facility]   Nursing information obtained from:  Patient Demographic factors:  Caucasian, Unemployed Current Mental Status:  NA Loss Factors:  Legal issues Historical Factors:  Impulsivity, Family history of mental illness or substance abuse Risk Reduction Factors:  Sense of responsibility to family, Living with another person, especially a relative  Total Time spent with patient: 45 minutes Principal Problem: Suicidal Attempt  Diagnosis:   Patient Active Problem List   Diagnosis Date Noted  . MDD (major depressive disorder) (H[facility]) [F32.9] 09/18/2015  . Overdose [T50.901A] 09/16/2015  . Suicidal behavior [F48.9] 09/16/2015  . Hypothermia [T68.XXXA] 09/16/2015     Continued Clinical Symptoms:  Alcohol Use Disorder Identification Test Final Score (AUDIT): 1 The "Alcohol Use Disorders Identification Test", Guidelines for Use in Primary Care, Second Edition.  World Science writerHealth Organization [facility](WHO). Score between 0-7:  no or low risk or alcohol related problems. Score between 8-15:  moderate risk of alcohol related problems. Score between 16-19:  high risk of alcohol related problems. Score 20 or above:  warrants further diagnostic evaluation for alcohol dependence and treatment.   CLINICAL FACTORS:  50 year old female, reports anxiety and some depression, which she attributes to a DUI charge in 2014, which has been continued in court. She had court date for this last week. In the context of this stressor and reported marital stress/tension, she overdosed on prescribed medications and alcohol. Was initially admitted to medical unit and transferred to psychiatric unit when cleared medically. Of note, patient denies abusing alcohol or BZDs and states she had stopped taking BZDs prior to her overdose- not presenting with any WDL symptoms at this time.     Psychiatric Specialty Exam: ROS  Blood pressure 114/78, pulse 69, temperature 97.9 F (36.6 C), temperature  source Oral, resp. rate 16, height 5\' 3"  (1.6 m), weight 163 lb 8 oz (74.163 kg).Body mass index is 28.97 kg/(m^2).   see admit note MSE  COGNITIVE FEATURES THAT CONTRIBUTE TO RISK:  Closed-mindedness and Loss of executive function    SUICIDE RISK:   Moderate:  Frequent suicidal ideation with limited intensity, and duration, some specificity in terms of plans, no associated intent, good self-control, limited dysphoria/symptomatology, some risk factors present, and identifiable protective factors, including available and accessible social support.  PLAN OF CARE: Patient will be admitted to inpatient psychiatric unit for stabilization and safety. Will provide and encourage milieu participation. Provide medication management and maked adjustments as needed.  Will follow daily.    I certify that inpatient services furnished can reasonably be expected to improve the patient's condition.   Nehemiah MassedOBOS, FERNANDO, MD 09/19/2015, 1:58 PM

## 2015-09-19 NOTE — [facility] Group Notes (Signed)
[facility] LCSW Group Therapy  09/19/2015 11 AM  Type of Therapy:  Group Therapy  Participation Level:  Active  Participation Quality:  Appropriate, Attentive and Sharing  Affect:  Appropriate and Flat  Cognitive:  Alert and Oriented  Insight:  Developing/Improving  Engagement in Therapy:  Engaged  Modes of Intervention:  Discussion, Exploration, Rapport Building, Socialization and Support  Summary of Progress/Problems: The main focus of today's process group was for the patient to identify ways in which they have in the past sabotaged their own recovery. Motivational Interviewing was utilized to ask the group members what they get out of their self sabotaging behavior(s), and what reasons they may have for wanting to change. The Stages of Change were explained using a handout, and patients identified where they currently are with regard to stages of change. Patient shared that she had challenged herself toady to come out of room into dayroom and engage in socialization by talking to another client. Patient identfied with contemplation stage reports she often spends more time planning than actually doing things that might be in her own best interest.   Carney Bernatherine C Harrill, LCSW

## 2015-09-19 NOTE — H&P (Signed)
Psychiatric Admission Assessment Adult  Patient Identification: Linda Hubbard MRN:  [id number] Date of Evaluation:  09/19/2015 Chief Complaint:   " I guess  I overdosed " Principal Diagnosis: Suicidal Attempt . Diagnosis:   Patient Active Problem List   Diagnosis Date Noted  . MDD (major depressive disorder) ([place]) [F32.9] 09/18/2015  . Overdose [T50.901A] 09/16/2015  . Suicidal behavior [F48.9] 09/16/2015  . Hypothermia [T68.XXXA] 09/16/2015   History of Present Illness::  28 year married female, who recently overdosed on alcohol and medications  ( 2/28) . She was initially admitted to medical unit due to overdose .  States she has been  under " a lot of stress" due to  having a   DUI  Charge (  in 2014) . States " I had a court date last Monday, so  I overdosed the day before  "  States  " the court date just keeps getting continued ,and the anticipation of the outcome and how it may affect my life is really stressful".  States overdose was impulsive, not planned out but states her intention was suicide at the time . " I just did not want to worry about what the future holds ". States she took  Xanax , Lyrica, Alcohol. States " I don't remember how many I took, I just fell asleep".  Husband found her later unresponsive .  Of note, states she had " stopped everything " and had  not been taking prescribed xanax, lyrica , or adderall recently .   Associated Signs/Symptoms: Depression Symptoms:  depressed mood, suicidal attempt,  However, denies anhedonia, denies prior suicidal ideations, denies changes in sleep, appetite .  (Hypo) Manic Symptoms:  Denies  Anxiety Symptoms: States significant anxiety, excessive worrying, particularly about outcome of DUI charge, as she fears it may affect her life negatively- patient is an Therapist, sports  . States she has had panic attacks , but not recently, denies agoraphobia  Psychotic Symptoms:  denies  PTSD Symptoms: Does not endorse  Total Time spent with patient:  45 minutes  Past Psychiatric History: no prior psychiatric admissions, one prior suicide attempt 15 years ago by overdose  in the context of marital stress at the time . Denies history of cutting, denies history of explosiveness, denies history of psychosis, denies history of mania . States that she feels that her anxiety, depression have occurred in the context of DUI charge and anticipation anxiety related to DUI charge and court dates.   Is the patient at risk to self? Yes.    Has the patient been a risk to self in the past 6 months? Yes.    Has the patient been a risk to self within the distant past? Yes.    Is the patient a risk to others? No.  Has the patient been a risk to others in the past 6 months? No.  Has the patient been a risk to others within the distant past? No.   Prior Inpatient Therapy:   None   Prior Outpatient Therapy:  Follows up with Dr. Jimmye Norman at [facility]   Alcohol Screening: 1. How often do you have a drink containing alcohol?: Monthly or less 2. How many drinks containing alcohol do you have on a typical day when you are drinking?: 1 or 2 3. How often do you have six or more drinks on one occasion?: Never Preliminary Score: 0 9. Have you or someone else been injured as a result of your drinking?: No 10. Has a  relative or friend or a doctor or another health worker been concerned about your drinking or suggested you cut down?: No Alcohol Use Disorder Identification Test Final Score (AUDIT): 1 Brief Intervention: AUDIT score less than 7 or less-screening does not suggest unhealthy drinking-brief intervention not indicated Substance Abuse History in the last 12 months:  Denies alcohol abuse, states she drinks once a year if that, denies any illicit drug abuse, she is prescribed Xanax, Adderall, Ambien, but denies abusing them .  Consequences of Substance Abuse: History of DUI which she states was related to stimulants .  Previous Psychotropic Medications:  yes , states she has been on Wellbutrin XL, Lexapro, Cymbalta in the past . As noted was recently prescribed Adderall, Xanax, but states she had not been taking recently .  Psychological Evaluations:  No  Past Medical History: Denies any medical illnesses , NKDA, does not smoke, stopped three years ago Past Medical History  Diagnosis Date  . Anxiety   . ADHD (attention deficit hyperactivity disorder)   . Depression     Past Surgical History  Procedure Laterality Date  . Abdominal hysterectomy    . Tonsillectomy     Family History:  Parents died when she was a child, states father 9 history is unknown, mother died young from breast cancer, states she was raised by grandparents, has two sisters.  Family History  Problem Relation Age of Onset  . Breast cancer Mother   . Hypertension Sister   . Alcohol abuse Maternal Grandfather   . Diabetes Maternal Grandmother   . Atrial fibrillation Maternal Grandmother    Family Psychiatric  History:  Denies any known family psychiatric history, denies any suicides in family, grandfather , uncle has history of alcohol dependence  Tobacco Screening: stopped smoking three years ago Social History:  Married x 22 years, has four children, ages 35, 31,22 , 65, lives at home with two children, patient is an Therapist, sports, not currently working , DUI charge , no financial issues .   History  Alcohol Use No     History  Drug Use No    Additional Social History:  Allergies:   Allergies  Allergen Reactions  . Extract Of Poison Oak Swelling    Itching    Lab Results:  Results for orders placed or performed during the hospital encounter of 09/18/15 (from the past 48 hour(s))  Basic metabolic panel     Status: None   Collection Time: 09/19/15  6:30 AM  Result Value Ref Range   Sodium 142 135 - 145 mmol/L   Potassium 3.6 3.5 - 5.1 mmol/L   Chloride 107 101 - 111 mmol/L   CO2 26 22 - 32 mmol/L   Glucose, Bld 88 65 - 99 mg/dL   BUN 10 6 - 20 mg/dL    Creatinine, Ser 0.55 0.44 - 1.00 mg/dL   Calcium 8.9 8.9 - 10.3 mg/dL   GFR calc non Af Amer >60 >60 mL/min   GFR calc Af Amer >60 >60 mL/min    Comment: (NOTE) The eGFR has been calculated using the CKD EPI equation. This calculation has not been validated in all clinical situations. eGFR's persistently <60 mL/min signify possible Chronic Kidney Disease.    Anion gap 9 5 - 15    Comment: Performed at [facility]    Blood Alcohol level:  Lab Results  Component Value Date   [facility] <5 09/16/2015   [facility]  04/09/2008    <5  LOWEST DETECTABLE LIMIT FOR SERUM ALCOHOL IS 11 mg/dL FOR MEDICAL PURPOSES ONLY    Metabolic Disorder Labs:  No results found for: HGBA1C, MPG No results found for: PROLACTIN No results found for: CHOL, TRIG, HDL, CHOLHDL, VLDL, LDLCALC  Current Medications: Current Facility-Administered Medications  Medication Dose Route Frequency Provider Last Rate Last Dose  . acetaminophen (TYLENOL) tablet 650 mg  650 mg Oral Q6H PRN Jenne Campus, MD      . alum & mag hydroxide-simeth (MAALOX/MYLANTA) 200-200-20 MG/5ML suspension 30 mL  30 mL Oral Q4H PRN Jenne Campus, MD      . hydrOXYzine (ATARAX/VISTARIL) tablet 25 mg  25 mg Oral Q6H PRN Jenne Campus, MD   25 mg at 09/18/15 2152  . magnesium hydroxide (MILK OF MAGNESIA) suspension 30 mL  30 mL Oral Daily PRN Jenne Campus, MD       PTA Medications: Prescriptions prior to admission  Medication Sig Dispense Refill Last Dose  . ALPRAZolam (XANAX) 0.25 MG tablet Take 1 tablet (0.25 mg total) by mouth 3 (three) times daily as needed for anxiety. 1 tablet 0     Musculoskeletal: Strength & Muscle Tone: within normal limits  No restlessness, no tremors, no diaphoresis Gait & Station: normal Patient leans: N/A  Psychiatric Specialty Exam: Physical Exam  Review of Systems  Constitutional: Negative.   HENT: Negative.   Eyes: Negative.   Respiratory: Negative.   Cardiovascular:  Negative.   Gastrointestinal: Negative.   Genitourinary: Negative.   Musculoskeletal: Negative.   Skin: Negative.   Neurological: Negative for seizures.  Endo/Heme/Allergies: Negative.   Psychiatric/Behavioral: Positive for suicidal ideas.  All other systems reviewed and are negative.   Blood pressure 114/78, pulse 69, temperature 97.9 F (36.6 C), temperature source Oral, resp. rate 16, height '5\' 3"'  (1.6 m), weight 163 lb 8 oz (74.163 kg).Body mass index is 28.97 kg/(m^2).  General Appearance: Well Groomed  Engineer, water::  Good  Speech:  Normal Rate  Volume:  Normal  Mood:  minimizes depression, states she is anxious   Affect:  anxious, reactive, does smile at times appropriately   Thought Process:  Linear  Orientation:  Full (Time, Place, and Person)  Thought Content:  no hallucinations, no delusions   Suicidal Thoughts:  at this time denies any suicidal ideations, denies any self injurious ideations , states " I regret the overdose 100 % , I do not want to die "  Homicidal Thoughts:  No  Memory:  recent and remote grossly intact   Judgement:  Fair  Insight:  Fair  Psychomotor Activity:  Normal- no current withdrawal symptoms, no tremors, no diaphoresis, no restlessness   Concentration:  Good  Recall:  Good  Fund of Knowledge:Good  Language: Good  Akathisia:  Negative  Handed:  Right  AIMS (if indicated):     Assets:  Communication Skills Desire for Improvement Resilience  ADL's:  Intact  Cognition: WNL  Sleep:  Number of Hours: 6.25     Treatment Plan Summary: Daily contact with patient to assess and evaluate symptoms and progress in treatment, Medication management, Plan inpatient admission and medications as below   Observation Level/Precautions:  15 minute checks  Laboratory:  As needed, will order TSH   Psychotherapy:  Milieu , support  Medications:  We discussed medication options- wants to avoid any medication that can be associated often with weight gain -   patient agrees to antidepressant trial and agrees to [facility] trial- we discussed side effects- start  14 mgrs QDAY  ATIVAN PRNs for potential alcohol or BZD withdrawal symptoms   Consultations:  As needed   Discharge Concerns:-    Estimated LOS: 5-6 days   Other:     I certify that inpatient services furnished can reasonably be expected to improve the patient's condition.    Neita Garnet, MD 3/4/201711:53 AM

## 2015-09-19 NOTE — Progress Notes (Signed)
Patient ID: Linda Hubbard, female   DOB: 1965/08/25, 50 y.o.   MRN: [id number] Patient was seen on the unit for c/o right hand pain. Patient has edema to dorsum of right with what appears to be a hematoma forming. Patient denies injury, trauma, or venipuncture to the area. The area is tender to touch.   Plan: 1. Will observe over night 2. Ibuprofen 600 mg PO Q6H prn pain/swelling 3. Apply ice pack to the area every 2-4 hours for 15-20 minutes  Linda SamFran Jahmire Ruffins, FNP-BC [facility] 09/19/2015       11:25 PM

## 2015-09-20 NOTE — Progress Notes (Signed)
D: Pt has anxious affect and mood.  She reports her goal is to "socialize and find the comfort zone here and utilize the tools you guys are offering."  Pt has been interacting with peers appropriately, playing cards in the day room.  She attended evening group.  Pt denies SI/HI, denies hallucinations.  Pt has been visible in milieu interacting with peers and staff appropriately.  Pt attended evening group.  Pt approached Probation officer after having vitals taken and showed right dorsal hand to Probation officer which was raised and edematous.  She denied pain and itching at the time, only reporting minor discomfort/sensitivity.   A: Met with pt and provided support and encouragement.  Actively listened to pt.  On-site provider notified of right dorsal hand edema.  On-site provider assessed pt (see note).  Ice packs provided to pt, PRN medication administered for pain/swelling, PRN medication administered for anxiety.  Medications administered per order.  Restricted extremity armband placed on right hand per on-site provider's instruction to refrain from obtaining vitals using right arm.   R: Pt is compliant with medications.  Pt verbally contracts for safety.  Will continue to monitor and assess.  She reported she was relieved that on-site provider assessed right hand edema.

## 2015-09-20 NOTE — Progress Notes (Signed)
D: Pt affect is animated and pt appeared anxious on approach. Pt reported feeling better today and did not verbalize any complaints. Pt reported fair sleep and appetite. Pt denied suicidal thoughts and verbally contracted for safety. Pt reported decreased depression today. Pt rated anxiety 2/10. Pt denied withdrawal symptoms from alcohol. Pt compliant with taking meds and attending groups.  A:  Medications reviewed with pt. Medications administered as ordered per MD. Verbal support provided. Pt encouraged to attend groups. 15 minute checks performed for safety. Pt right hand assessed for swelling. No swelling noted. Pt right hand (posterior) bruised.  R: Pt verbalized understanding of med regimen. Pt stated goal "I often look at my own actions, thoughts, remarks as black and Gilberta Peeters, my goal is to look at myself today with rose colored glasses, not thinking too much". Pt receptive to tx.

## 2015-09-20 NOTE — Plan of Care (Signed)
Problem: Diagnosis: Increased Risk For Suicide Attempt Goal: STG-Patient Will Attend All Groups On The Unit Outcome: Progressing Pt attended evening group on 09/19/15     

## 2015-09-20 NOTE — Progress Notes (Signed)
[facility] MD Progress Note  09/20/2015 3:35 PM Linda Hubbard  MRN:  [id number] Subjective:  Patient reports she is feeling better. At this time reports her mood is improved and reports she is less anxious. Reports improving insight regarding anxiety related to DUI charges. States " I realize that I have been over thinking, and that really there is no reason to worry so much. " States " I think too much about it, and before I know it, I am catastrophising, and this makes the anxiety worse ".  She is looking forward to visit from children and husband later today, and is hoping for discharge soon. Denies any medication side effects. Denies any symptoms of withdrawal. Objective: I have reviewed chart and have met with patient. Today improved compared to admission - mood is improved, affect is more reactive, and states she is gaining insight into excessive anxiety regarding her DUI charge. States " now that I am thinking more clearly, I see that being charged with a DUI is not the end of the world". States she is now going to try to " enjoy my life and my family more, and not think so much about it ". No medication side effects  No withdrawal symptoms Visible in day room, interacting appropriately with peers  Labs - BMP WNL.  Principal Problem: Overdose Diagnosis:   Patient Active Problem List   Diagnosis Date Noted  . MDD (major depressive disorder) ([place]) [F32.9] 09/18/2015  . Overdose [T50.901A] 09/16/2015  . Suicidal behavior [F48.9] 09/16/2015  . Hypothermia [T68.XXXA] 09/16/2015   Total Time spent with patient: 20 minutes    Past Medical History:  Past Medical History  Diagnosis Date  . Anxiety   . ADHD (attention deficit hyperactivity disorder)   . Depression     Past Surgical History  Procedure Laterality Date  . Abdominal hysterectomy    . Tonsillectomy     Family History:  Family History  Problem Relation Age of Onset  . Breast cancer Mother   . Hypertension Sister   .  Alcohol abuse Maternal Grandfather   . Diabetes Maternal Grandmother   . Atrial fibrillation Maternal Grandmother    Social History:  History  Alcohol Use No     History  Drug Use No    Social History   Social History  . Marital Status: Married    Spouse Name: N/A  . Number of Children: N/A  . Years of Education: N/A   Social History Main Topics  . Smoking status: Former Smoker -- 1.00 packs/day    Types: Cigarettes    Start date: 02/18/1985    Quit date: 02/18/2013  . Smokeless tobacco: Never Used  . Alcohol Use: No  . Drug Use: No  . Sexual Activity: No   Other Topics Concern  . None   Social History Narrative   Additional Social History:   Sleep: Good  Appetite:  Good  Current Medications: Current Facility-Administered Medications  Medication Dose Route Frequency Provider Last Rate Last Dose  . acetaminophen (TYLENOL) tablet 650 mg  650 mg Oral Q6H PRN Jenne Campus, MD      . alum & mag hydroxide-simeth (MAALOX/MYLANTA) 200-200-20 MG/5ML suspension 30 mL  30 mL Oral Q4H PRN Jenne Campus, MD      . FLUoxetine (PROZAC) capsule 20 mg  20 mg Oral Daily Myer Peer Gable Odonohue, MD   20 mg at 09/20/15 0807  . hydrOXYzine (ATARAX/VISTARIL) tablet 25 mg  25 mg Oral Q6H PRN Felicita Gage  A Zian Delair, MD   25 mg at 09/19/15 2059  . ibuprofen (ADVIL,MOTRIN) tablet 600 mg  600 mg Oral Q6H PRN Lurena Nida, NP   600 mg at 09/19/15 2346  . loperamide (IMODIUM) capsule 2-4 mg  2-4 mg Oral PRN Jenne Campus, MD      . LORazepam (ATIVAN) tablet 1 mg  1 mg Oral Q6H PRN Myer Peer Chyrl Elwell, MD      . magnesium hydroxide (MILK OF MAGNESIA) suspension 30 mL  30 mL Oral Daily PRN Jenne Campus, MD      . multivitamin with minerals tablet 1 tablet  1 tablet Oral Daily Jenne Campus, MD   1 tablet at 09/20/15 0807  . ondansetron (ZOFRAN-ODT) disintegrating tablet 4 mg  4 mg Oral Q6H PRN Jenne Campus, MD      . thiamine (VITAMIN B-1) tablet 100 mg  100 mg Oral Daily Jenne Campus, MD   100 mg at 09/20/15 0807    Lab Results:  Results for orders placed or performed during the hospital encounter of 09/18/15 (from the past 48 hour(s))  Basic metabolic panel     Status: None   Collection Time: 09/19/15  6:30 AM  Result Value Ref Range   Sodium 142 135 - 145 mmol/L   Potassium 3.6 3.5 - 5.1 mmol/L   Chloride 107 101 - 111 mmol/L   CO2 26 22 - 32 mmol/L   Glucose, Bld 88 65 - 99 mg/dL   BUN 10 6 - 20 mg/dL   Creatinine, Ser 0.55 0.44 - 1.00 mg/dL   Calcium 8.9 8.9 - 10.3 mg/dL   GFR calc non Af Amer >60 >60 mL/min   GFR calc Af Amer >60 >60 mL/min    Comment: (NOTE) The eGFR has been calculated using the CKD EPI equation. This calculation has not been validated in all clinical situations. eGFR's persistently <60 mL/min signify possible Chronic Kidney Disease.    Anion gap 9 5 - 15    Comment: Performed at [facility]    Blood Alcohol level:  Lab Results  Component Value Date   ETH <5 09/16/2015   [facility]  04/09/2008    <5        LOWEST DETECTABLE LIMIT FOR SERUM ALCOHOL IS 11 mg/dL FOR MEDICAL PURPOSES ONLY    Physical Findings: AIMS: Facial and Oral Movements Muscles of Facial Expression: None, normal Lips and Perioral Area: None, normal Jaw: None, normal Tongue: None, normal,Extremity Movements Upper (arms, wrists, hands, fingers): None, normal Lower (legs, knees, ankles, toes): None, normal, Trunk Movements Neck, shoulders, hips: None, normal, Overall Severity Severity of abnormal movements (highest score from questions above): None, normal Incapacitation due to abnormal movements: None, normal Patient's awareness of abnormal movements (rate only patient's report): No Awareness, Dental Status Current problems with teeth and/or dentures?: No Does patient usually wear dentures?: No  CIWA:  CIWA-Ar Total: 0 COWS:     Musculoskeletal: Strength & Muscle Tone: within normal limits Gait & Station: normal Patient leans:  N/A  Psychiatric Specialty Exam: ROS no  headache, no chest pain, no shortness of breath, no vomiting   Blood pressure 111/71, pulse 75, temperature 97.5 F (36.4 C), temperature source Oral, resp. rate 18, height _0  (1.6 m), weight 163 lb 8 oz (74.163 kg).Body mass index is 28.97 kg/(m^2).  General Appearance: Well Groomed  Eye Contact::  Good  Speech:  Normal Rate  Volume:  Normal  Mood:  improved, less  depressed, less anxious  Affect:  reactive, smiles at times appropriately  Thought Process:  Linear  Orientation:  Full (Time, Place, and Person)  Thought Content:  denies hallucinations, no delusions, less ruminative  Suicidal Thoughts:  No denies any suicidal ideations or any self injurious ideations  Homicidal Thoughts:  No  Memory:  recent and remote grossly intact   Judgement:  Other:  improved  Insight:  improving   Psychomotor Activity:  Normal  Concentration:  Good  Recall:  Good  Fund of Knowledge:Good  Language: Good  Akathisia:  Negative  Handed:  Right  AIMS (if indicated):     Assets:  Communication Skills Desire for Improvement Resilience  ADL's:  Intact  Cognition: WNL  Sleep:  Number of Hours: 5.5  Assessment - patient presenting with improved mood and improved range of affect. Denies any ongoing SI. Has been interactive and sociable in milieu. Tolerating Prozac trial well thus far. No WDL symptoms. Reports improving insight regarding excessive anxiety related to DUI charge, and increased motivation to focus more on positive aspects of her life such as her family, avoid ruminating.  Treatment Plan Summary: Daily contact with patient to assess and evaluate symptoms and progress in treatment, Medication management, Plan inpatient treatment  and medications as below Continue to encourage group and milieu participation to work on coping skills and symptom reduction Continue Prozac 20 mgrs QDAY for depression and anxiety Continue Vistaril PRNs for anxiety as  needed  Treatment team to work on disposition options Neita Garnet, MD 09/20/2015, 3:35 PM

## 2015-09-20 NOTE — Progress Notes (Signed)
Swelling to right hand has gone down significantly overnight.  Pt denies discomfort/pain at this time.  Pt provided with additional ice packs.  She denies needs/concerns at this time.

## 2015-09-20 NOTE — Progress Notes (Signed)
Adult Psychoeducational Group Note  Date:  09/20/2015 Time:  0930 am  Group Topic/Focus:  Orientation:   The focus of this group is to educate the patient on the purpose and policies of crisis stabilization and provide a format to answer questions about their admission.  The group details unit policies and expectations of patients while admitted.  Participation Level:  Active  Participation Quality:  Appropriate  Affect:  Appropriate  Cognitive:  Appropriate  Insight: Appropriate  Engagement in Group:  Engaged  Modes of Intervention:  Discussion and Orientation  Additional Comments:    Noriah Osgood L 09/20/2015, 3:16 PM

## 2015-09-20 NOTE — Progress Notes (Signed)
Adult Psychoeducational Group Note  Date:  09/20/2015 Time:  9:42 PM  Group Topic/Focus:  Wrap-Up Group:   The focus of this group is to help patients review their daily goal of treatment and discuss progress on daily workbooks.  Participation Level:  Active  Participation Quality:  Appropriate  Affect:  Appropriate  Cognitive:  Alert  Insight: Appropriate  Engagement in Group:  Engaged  Modes of Intervention:  Discussion  Additional Comments:  Patient goal for today was to not have high expectations. On a scale between 1-10, (1=worse, 10=best) patient rated her day a 9.  Laurieanne Galloway L Seraphim Trow 09/20/2015, 9:42 PM

## 2015-09-20 NOTE — [facility] Group Notes (Signed)
[facility] LCSW Group Therapy  09/20/2015 11:00 AM  Type of Therapy:  Group Therapy: Feelings Around Returning Home & Establishing a Supportive Framework and Activity to Identify signs of Improvement or Decompensation   Participation Level:  Active  Participation Quality:  Appropriate  Affect:  Appropriate  Cognitive:  Appropriate  Insight:  Developing/Improving  Engagement in Therapy:  Developing/Improving  Modes of Intervention:  Activity, Clarification, Discussion, Socialization and Support  Summary of Progress/Problems:   Pt engaged easily during group session. As patients processed their anxiety about discharge and described healthy supports patient described that she has not been open with her main supports.  Patient chose a visual to represent decompensation as overthinking and improvement as dancing in the rain.  Carney Bernatherine C Harrill, LCSW

## 2015-09-21 DIAGNOSIS — T5192XA Toxic effect of unspecified alcohol, intentional self-harm, initial encounter: Secondary | ICD-10-CM

## 2015-09-21 DIAGNOSIS — T50902A Poisoning by unspecified drugs, medicaments and biological substances, intentional self-harm, initial encounter: Secondary | ICD-10-CM | POA: Insufficient documentation

## 2015-09-21 MED ORDER — FLUOXETINE HCL 20 MG PO CAPS: 20.0000 mg | ORAL_CAPSULE | Freq: Every day | ORAL | Status: DC

## 2015-09-21 NOTE — [facility] Suicide Risk Assessment (Signed)
[facility]   Principal Problem: Overdose Discharge Diagnoses:  Patient Active Problem List   Diagnosis Date Noted  . MDD (major depressive disorder) ([facility]) [F32.9] 09/18/2015  . Overdose [T50.901A] 09/16/2015  . Suicidal behavior [F48.9] 09/16/2015  . Hypothermia [T68.XXXA] 09/16/2015    Total Time spent with patient: 30 minutes  Musculoskeletal: Strength & Muscle Tone: within normal limits Gait & Station: normal Patient leans: N/A  Psychiatric Specialty Exam: ROS no headache, no shortness of breath, no chest pain, no nausea, no vomiting   Blood pressure 124/81, pulse 78, temperature 97.3 F (36.3 C), temperature source Oral, resp. rate 16, height 5\' 3"  (1.6 m), weight 163 lb 8 oz (74.163 kg).Body mass index is 28.97 kg/(m^2).  General Appearance: Well Groomed  Eye Contact::  Good  Speech:  Normal Rate409  Volume:  Normal  Mood:  improved, euthymic   Affect:  more reactive, smiling often, appropriately   Thought Process:  Linear  Orientation:  Full (Time, Place, and Person)  Thought Content:  no hallucinations, no delusions  Suicidal Thoughts:  No, denies any suicidal or self injurious ideations   Homicidal Thoughts:  No  Memory:  recent and remote grossly intact   Judgement:  Other:  improved   Insight:  improved  Psychomotor Activity:  Normal  Concentration:  Good  Recall:  Good  Fund of Knowledge:Good  Language: Good  Akathisia:  Negative  Handed:  Right  AIMS (if indicated):     Assets:  Communication Skills Desire for Improvement Resilience  Sleep:  Number of Hours: 6.25  Cognition: WNL  ADL's:  Intact   Mental Status Per Nursing Assessment::   On Admission:  NA  Demographic Factors:  50 year old married female, [employer] fund[employer] , lives at home with husband, has four children  Loss Factors: Concerns about DUI charges, some marital tension  Historical Factors: No prior psychiatric admissions, no prior suicidal attempts  Risk Reduction  Factors:   Responsible for children under 49 years of age, Sense of responsibility to family and Positive coping skills or problem solving skills  Continued Clinical Symptoms:  Alert and attentive, well related , pleasant, mood improved, presents euthymic, full range of affect , no thought disorder, no SI, no HI, no psychotic symptoms, future oriented .  Denies medication side effects  Cognitive Features That Contribute To Risk:  No gross cognitive deficits noted upon discharge. Is alert , attentive, and oriented x 3    Suicide Risk:  Mild:  Suicidal ideation of limited frequency, intensity, duration, and specificity.  There are no identifiable plans, no associated intent, mild dysphoria and related symptoms, good self-control (both objective and subjective assessment), few other risk factors, and identifiable protective factors, including available and accessible social support.  Follow-up Information    Go to Dr. Mayford KnifeWilliams.   Why:  Appointment date/time to be confirmed by social worker   Contact information:   [facility] [address] [place]  [id number] Telephone:  [phone]      Plan Of Care/Follow-up recommendations:  Activity:  as tolerated  Diet:  Regular Tests:  NA Other:  See below   Patient requesting discharge and there are no current grounds for involuntary commitment  Patient is leaving in good spirits. Husband is picking her up later today.   Nehemiah MassedOBOS, Vernia Teem, MD 09/21/2015, 10:20 AM

## 2015-09-21 NOTE — Tx Team (Signed)
Interdisciplinary Treatment Plan Update (Adult) Date: 09/21/2015   Date: 09/21/2015 10:36 AM  Progress in Treatment:  Attending groups: Yes  Participating in groups: Yes  Taking medication as prescribed: Yes  Tolerating medication: Yes  Family/Significant othe contact made: Yes with husband Patient understands diagnosis: Yes AEB seeking help with depression Discussing patient identified problems/goals with staff: Yes  Medical problems stabilized or resolved: Yes  Denies suicidal/homicidal ideation: Yes Patient has not harmed self or Others: Yes   New problem(s) identified: None identified at this time.   Discharge Plan or Barriers: Pt will return home and follow-up with outpatient providers   Additional comments:  Patient and CSW reviewed pt's identified goals and treatment plan. Patient verbalized understanding and agreed to treatment plan. CSW reviewed [facility] "Discharge Process and Patient Involvement" Form. Pt verbalized understanding of information provided and signed form.   Reason for Continuation of Hospitalization:  Depression Medication stabilization Suicidal ideation  Estimated length of stay: 0 days  Review of initial/current patient goals per problem list:   1.  Goal(s): Patient will participate in aftercare plan  Met:  Yes  Target date: 3-5 days from date of admission   As evidenced by: Patient will participate within aftercare plan AEB aftercare provider and housing plan at discharge being identified.   09/21/15: Pt will return home and follow-up with outpatient resources  2.  Goal (s): Patient will exhibit decreased depressive symptoms and suicidal ideations.  Met:  Yes  Target date: 3-5 days from date of admission   As evidenced by: Patient will utilize self rating of depression at 3 or below and demonstrate decreased signs of depression or be deemed stable for discharge by MD.  09/21/15: Pt rates depression at 0/10; denies SI  3.  Goal(s): Patient will  demonstrate decreased signs and symptoms of anxiety.  Met:  Adequate for DC  Target date: 3-5 days from date of admission   As evidenced by: Patient will utilize self rating of anxiety at 3 or below and demonstrated decreased signs of anxiety, or be deemed stable for discharge by MD  09/21/15: MD feels that Pt's symptoms have decreased to the point that they can be managed in an outpatient setting.   Attendees:  Patient:    Family:    Physician: Dr. Parke Poisson, MD  09/21/2015 10:36 AM  Nursing: Lars Pinks, RN Case manager  09/21/2015 10:36 AM  Clinical Social Worker Peri Maris, [place] 09/21/2015 10:36 AM  Other: Tilden Fossa, [place] 09/21/2015 10:36 AM  Clinical: Darrol Angel, RN; Mayra Neer, RN 09/21/2015 10:36 AM  Other: , RN Charge Nurse 09/21/2015 10:36 AM  Other:    Peri Maris, [place] [facility] [phone]

## 2015-09-21 NOTE — Discharge Summary (Signed)
Physician Discharge Summary Note  Patient:  Linda Hubbard is an 49 y.o., female MRN:  [id number] DOB:  Jul 11, 1966 Patient phone:  [phone] (home)  Patient address:   [phone] [place] [address],  Total Time spent with patient: 30 minutes  Date of Admission:  09/18/2015 Date of Discharge: 09/21/2015  Reason for Admission: PER HPI- married female, who recently overdosed on alcohol and medications ( 2/28) . She was initially admitted to medical unit due to overdose . States she has been under " a lot of stress" due to having a DUI Charge ( in 2014) . States " I had a court date last Monday, so I overdosed the day before " States " the court date just keeps getting continued ,and the anticipation of the outcome and how it may affect my life is really stressful". States overdose was impulsive, not planned out but states her intention was suicide at the time . " I just did not want to worry about what the future holds ". States she took Xanax , Lyrica, Alcohol. States " I don't remember how many I took, I just fell asleep". Husband found her later unresponsive . Of note, states she had " stopped everything " and had not been taking prescribed xanax, lyrica , or adderall recently .    Principal Problem: Overdose Discharge Diagnoses: Patient Active Problem List   Diagnosis Date Noted  . Suicide attempt by drug ingestion (HCC) [T50.902A]   . MDD (major depressive disorder) (HCC) [F32.9] 09/18/2015  . Overdose [T50.901A] 09/16/2015  . Suicidal behavior [F48.9] 09/16/2015  . Hypothermia [T68.XXXA] 09/16/2015    Past Psychiatric History: See Above  Past Medical History:  Past Medical History  Diagnosis Date  . Anxiety   . ADHD (attention deficit hyperactivity disorder)   . Depression     Past Surgical History  Procedure Laterality Date  . Abdominal hysterectomy    . Tonsillectomy     Family History:  Family History  Problem Relation Age of Onset  .  Breast cancer Mother   . Hypertension Sister   . Alcohol abuse Maternal Grandfather   . Diabetes Maternal Grandmother   . Atrial fibrillation Maternal Grandmother    Family Psychiatric  History: See Above Social History:  History  Alcohol Use No     History  Drug Use No    Social History   Social History  . Marital Status: Married    Spouse Name: N/A  . Number of Children: N/A  . Years of Education: N/A   Social History Main Topics  . Smoking status: Former Smoker -- 1.00 packs/day    Types: Cigarettes    Start date: 02/18/1985    Quit date: 02/18/2013  . Smokeless tobacco: Never Used  . Alcohol Use: No  . Drug Use: No  . Sexual Activity: No   Other Topics Concern  . None   Social History Narrative    Hospital Course: MANDISA PERSINGER was admitted for Overdose ,  and crisis management.  Pt was treated discharged with the medications listed below under Medication List.  Medical problems were identified and treated as needed.  Home medications were restarted as appropriate.  Improvement was monitored by observation and Linda Hubbard 's daily report of symptom reduction.  Emotional and mental status was monitored by daily self-inventory reports completed by Linda Hubbard and clinical staff.         Linda Hubbard was evaluated by the treatment team  for stability and plans for continued recovery upon discharge. Linda Hubbard 's motivation was an integral factor for scheduling further treatment. Employment, transportation, bed availability, health status, family support, and any pending legal issues were also considered during hospital stay. Pt was offered further treatment options upon discharge including but not limited to Residential, Intensive Outpatient, and Outpatient treatment.  Linda Hubbard will follow up with the services as listed below under Follow Up Information.     Upon completion of this admission the patient was both mentally and medically  stable for discharge denying suicidal/homicidal ideation, auditory/visual/tactile hallucinations, delusional thoughts and paranoia.   Linda Hubbard responded well to treatment with Prozac and  Vistaril without adverse effects. Pt demonstrated improvement without reported or observed adverse effects to the point of stability appropriate for outpatient management. Pertinent labs include:BMP and Glucose , for which outpatient follow-up is necessary for lab recheck as mentioned below. Reviewed CBC, CMP, BAL, and UDS; all unremarkable aside from noted exceptions.   Physical Findings: AIMS: Facial and Oral Movements Muscles of Facial Expression: None, normal Lips and Perioral Area: None, normal Jaw: None, normal Tongue: None, normal,Extremity Movements Upper (arms, wrists, hands, fingers): None, normal Lower (legs, knees, ankles, toes): None, normal, Trunk Movements Neck, shoulders, hips: None, normal, Overall Severity Severity of abnormal movements (highest score from questions above): None, normal Incapacitation due to abnormal movements: None, normal Patient's awareness of abnormal movements (rate only patient's report): No Awareness, Dental Status Current problems with teeth and/or dentures?: No Does patient usually wear dentures?: No  CIWA:  CIWA-Ar Total: 0 COWS:     Musculoskeletal: Strength & Muscle Tone: within normal limits Gait & Station: normal Patient leans: N/A  Psychiatric Specialty Exam: SEE SRA BY MD  Review of Systems  Psychiatric/Behavioral: Negative for suicidal ideas. Depression: stable. The patient is not nervous/anxious.   All other systems reviewed and are negative.   Blood pressure 124/81, pulse 78, temperature 97.3 F (36.3 C), temperature source Oral, resp. rate 16, height  (1.6 m), weight 74.163 kg (163 lb 8 oz).Body mass index is 28.97 kg/(m^2).  Have you used any form of tobacco in the last 30 days? (Cigarettes, Smokeless Tobacco, Cigars, and/or  Pipes): No  Has this patient used any form of tobacco in the last 30 days? (Cigarettes, Smokeless Tobacco, Cigars, and/or Pipes) Yes, Yes, A prescription for an FDA-approved tobacco cessation medication was offered at discharge and the patient refused  Blood Alcohol level:  Lab Results  Component Value Date   ETH <5 09/16/2015   [facility]  04/09/2008    <5        LOWEST DETECTABLE LIMIT FOR SERUM ALCOHOL IS 11 mg/dL FOR MEDICAL PURPOSES ONLY    Metabolic Disorder Labs:  No results found for: HGBA1C, MPG No results found for: PROLACTIN No results found for: CHOL, TRIG, HDL, CHOLHDL, VLDL, LDLCALC  See Psychiatric Specialty Exam and Suicide Risk Assessment completed by Attending Physician prior to discharge.  Discharge destination:  Home  Is patient on multiple antipsychotic therapies at discharge:  No   Has Patient had three or more failed trials of antipsychotic monotherapy by history:  No  Recommended Plan for Multiple Antipsychotic Therapies: NA      Discharge Instructions    Activity as tolerated - No restrictions    Complete by:  As directed      Diet general    Complete by:  As directed      Discharge instructions    Complete  by:  As directed   Take all medications as prescribed. Keep all follow-up appointments as scheduled.  Do not consume alcohol or use illegal drugs while on prescription medications. Report any adverse effects from your medications to your primary care provider promptly.  In the event of recurrent symptoms or worsening symptoms, call 911, a crisis hotline, or go to the nearest emergency department for evaluation.            Medication List    STOP taking these medications        ALPRAZolam 0.25 MG tablet  Commonly known as:  XANAX      TAKE these medications      Indication   FLUoxetine 20 MG capsule  Commonly known as:  PROZAC  Take 1 capsule (20 mg total) by mouth daily.   Indication:  mood stabiliation       Follow-up Information     [facility]   Why:  at 10:30am for medication management with Dr. Daleen Boavi.   Contact information:   [address] [place]  [address] Telephone:  [phone] Fax:       Follow up with Pt declines referral for therapy.      Follow-up recommendations:  Activity:  as tolerated Diet:  heart healthy  Comments:  Take all medications as prescribed. Keep all follow-up appointments as scheduled.  Do not consume alcohol or use illegal drugs while on prescription medications. Report any adverse effects from your medications to your primary care provider promptly.  In the event of recurrent symptoms or worsening symptoms, call 911, a crisis hotline, or go to the nearest emergency department for evaluation.   Signed: Oneta Rackanika N Lewis, NP 09/21/2015, 12:00 PM   Patient seen, Suicide Assessment Completed.  Disposition Plan Reviewed

## 2015-09-21 NOTE — Progress Notes (Signed)
D: Pt has appropriate affect and pleasant mood.  She has been interactive with peers, smiling frequently and laughing.  She states "I've had a really good day" and reports the best part of her day was "feeling the connection, being able to connect with other people and seeing my own strengths and weaknesses."  Pt denies SI/HI, denies hallucinations, denies pain.  Pt attended evening group.   A: Met with pt and provided support and encouragement.  Actively listened to pt.  R: Pt verbally contracts for safety.  Will continue to monitor and assess.

## 2015-09-21 NOTE — [facility] Suicide Risk Assessment (Signed)
[facility] INPATIENT:  Family/Significant Other Suicide Prevention Education  Suicide Prevention Education:  Education Completed; Linda HerringRobert Kent Hubbard, Pt's husband [phone],  has been identified by the patient as the family member/significant other with whom the patient will be residing, and identified as the person(s) who will aid the patient in the event of a mental health crisis (suicidal ideations/suicide attempt).  With written consent from the patient, the family member/significant other has been provided the following suicide prevention education, prior to the and/or following the discharge of the patient.  The suicide prevention education provided includes the following:  Suicide risk factors  Suicide prevention and interventions  National Suicide Hotline telephone number  [facility] assessment telephone number  [facility] Emergency Assistance 911  [facility] and/or Residential Mobile Crisis Unit telephone number  Request made of family/significant other to:  Remove weapons (e.g., guns, rifles, knives), all items previously/currently identified as safety concern.    Remove drugs/medications (over-the-counter, prescriptions, illicit drugs), all items previously/currently identified as a safety concern.  The family member/significant other verbalizes understanding of the suicide prevention education information provided.  The family member/significant other agrees to remove the items of safety concern listed above.  Linda Hubbard, Linda Hubbard M 09/21/2015, 10:36 AM

## 2015-09-21 NOTE — Progress Notes (Signed)
  [facility] :  Will you be returning to the same living situation after discharge:  Yes,  Pt will return home At discharge, do you have transportation home?: Yes,  Pt husband to pick up Do you have the ability to pay for your medications: Yes,  Pt provided with prescriptions  Release of information consent forms completed and in the chart;  Patient's signature needed at discharge.  Patient to Follow up at: Follow-up Information    Follow up with [facility] On 10/07/2015.   Why:  at 10:30am for medication management with Dr. Daleen Boavi.   Contact information:   [address] [place]  [address] Telephone:  [phone] Fax:       Follow up with Pt declines referral for therapy.      Next level of care provider has [facility] Link:no  Safety Planning and Suicide Prevention discussed: Yes,  with husband; see SPE note for further details  Have you used any form of tobacco in the last 30 days? (Cigarettes, Smokeless Tobacco, Cigars, and/or Pipes): No  Has patient been referred to the Quitline?: N/A patient is not a smoker  Patient has been referred for addiction treatment: Pt. refused referral  Elaina HoopsCarter, Curtisha Bendix M 09/21/2015, 10:29 AM

## 2015-09-21 NOTE — Progress Notes (Signed)
Discharge note: Pt received both written and verbal discharge instructions. Pt verbalized understanding of discharge instructions. Pt agree to f/u appt and med regimen. Pt received prescriptions, AVS, SRA note and Transition record. Pt safely discharged to lobby.

## 2015-09-21 NOTE — Plan of Care (Signed)
Problem: Alteration in mood; excessive anxiety as evidenced by: Goal: LTG-Patient's behavior demonstrates decreased anxiety (Patient's behavior demonstrates anxiety and he/she is utilizing learned coping skills to deal with anxiety-producing situations)  Outcome: Progressing Pt denies having anxiety tonight.  She has been seen smiling and laughing in the day room with her peers.

## 2015-10-07 ENCOUNTER — Ambulatory Visit: Payer: PRIVATE HEALTH INSURANCE | Admitting: Psychiatry

## 2016-01-05 ENCOUNTER — Encounter: Payer: Self-pay | Admitting: Psychiatry

## 2016-01-05 ENCOUNTER — Ambulatory Visit (INDEPENDENT_AMBULATORY_CARE_PROVIDER_SITE_OTHER): Payer: PRIVATE HEALTH INSURANCE | Admitting: Psychiatry

## 2016-01-05 VITALS — BP 122/84 | HR 104 | Temp 98.1°F | Ht 63.0 in | Wt 179.8 lb

## 2016-01-05 DIAGNOSIS — F331 Major depressive disorder, recurrent, moderate: Secondary | ICD-10-CM

## 2016-01-05 MED ORDER — LAMOTRIGINE 25 MG PO TABS: 25.0000 mg | ORAL_TABLET | Freq: Every day | ORAL | Status: DC

## 2016-01-05 MED ORDER — FLUOXETINE HCL 20 MG PO TABS: 20.0000 mg | ORAL_TABLET | Freq: Every day | ORAL | Status: DC

## 2016-01-05 MED ORDER — ZOLPIDEM TARTRATE 5 MG PO TABS: 5.0000 mg | ORAL_TABLET | Freq: Every evening | ORAL | Status: DC | PRN

## 2016-01-05 NOTE — Progress Notes (Signed)
BH MD/PA/NP OP Progress Note  01/05/2016 2:27 PM Linda Hubbard  MRN:  [id number]  Subjective:  Patient  is a 50 year old female with history of depression who was recently discharged from the inpatient behavioral health unit in [place] presented for the follow-up appointment. She was previously following Dr. Mayford Knife. She appeared hyper and anxious during the interview. She was minimizing her inpatient admission and reported that she was admitted there due to overdose on her medications. She was drinking heavily at that time. She reported that she has not started taking the Prozac which was prescribed at the time of discharge from the hospital as she has something with the antidepressants and she does not want to take the medication. She was asking for the refill of Adderall which was prescribed by Dr. Mayford Knife. She reported that she has started several projects at home and Adderall helps her. She reported that she is sleeping poorly at night and has several bottles of Xanax at home and she is also taking when necessary Ambien. She is alternating with Benadryl. Her speech appears rambling and she has to be redirected during the interview. She became irate when I advised her that she will not be getting any prescription of Adderall at this time. 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  She stated that she will try the Prozac at this appointment and she has enough Ambien to try to have a return visit alternating with the Benadryl.     Chief Complaint: Chief Complaint    Follow-up; Medication Refill     Visit Diagnosis:     ICD-9-CM ICD-10-CM   1. Anxiety state 300.00 F41.1   2. Attention deficit hyperactivity disorder (ADHD), predominantly inattentive type 314.01 F90.0     Past Medical History:  Past Medical History  Diagnosis Date  . Anxiety   . ADHD (attention deficit hyperactivity disorder)   . Depression     Past Surgical  History  Procedure Laterality Date  . Abdominal hysterectomy    . Tonsillectomy     Family History:  Family History  Problem Relation Age of Onset  . Breast cancer Mother   . Hypertension Sister   . Alcohol abuse Maternal Grandfather   . Diabetes Maternal Grandmother   . Atrial fibrillation Maternal Grandmother    Social History:  Social History   Social History  . Marital Status: Married    Spouse Name: N/A  . Number of Children: N/A  . Years of Education: N/A   Social History Main Topics  . Smoking status: Former Smoker -- 1.00 packs/day    Types: Cigarettes    Start date: 02/18/1985    Quit date: 02/18/2013  . Smokeless tobacco: Never Used  . Alcohol Use: No  . Drug Use: No  . Sexual Activity: No   Other Topics Concern  . None   Social History Narrative   Additional History:   Assessment:   Musculoskeletal: Strength & Muscle Tone: within normal limits Gait & Station: normal Patient leans: N/A  Psychiatric Specialty Exam: HPI  Review of Systems  Constitutional: Positive for malaise/fatigue.  Psychiatric/Behavioral: Positive for depression and substance abuse. Negative for suicidal ideas, hallucinations and memory loss. Nervous/anxious: some general worries about children and stalking as discussed above. Insomnia: Response to Ambien.   All other systems reviewed and are negative.   Blood pressure 122/84, pulse 104, temperature 98.1 F (36.7 C), temperature source Tympanic, height  (1.6 m),  weight 179 lb 12.8 oz (81.557 kg), SpO2 95 %.Body mass index is 31.86 kg/(m^2).  General Appearance: Neat and Well Groomed  Eye Contact:  Good  Speech:  Pressured  Volume:  Normal  Mood:  Anxious and Depressed  Affect:  Depressed  Thought Process:  Disorganized  Orientation:  Full (Time, Place, and Person)  Thought Content:  WDL  Suicidal Thoughts:  No  Homicidal Thoughts:  No  Memory:  Immediate;   Good Recent;   Good Remote;   Good  Judgement:  Poor   Insight:  Lacking  Psychomotor Activity:  Negative  Concentration:  Good  Recall:  Good  Fund of Knowledge: Good  Language: Good  Akathisia:  Negative  Handed:  Right unknown   AIMS (if indicated):  n/A  Assets:  Communication Skills Desire for Improvement Social Support Vocational/Educational  ADL's:  Intact  Cognition: WNL  Sleep:  good   Is the patient at risk to self?  No. Has the patient been a risk to self in the past 6 months?  No. Has the patient been a risk to self within the distant past?  No. Is the patient a risk to others?  No. Has the patient been a risk to others in the past 6 months?  No. Has the patient been a risk to others within the distant past?  No.  Current Medications: Current Outpatient Prescriptions  Medication Sig Dispense Refill  . zolpidem (AMBIEN) 10 MG tablet   0   No current facility-administered medications for this visit.    Medical Decision Making:  Established Problem, Stable/Improving (1) and Review of Medication Regimen & Side Effects (2)  Treatment Plan Summary:Medication management and Plan   Patient reported that she will continue the Prozac 20 mg daily She has the prescription of Ambien and will alternate with Bendaryl.  We discussed about starting her on lamotrigine as a mood stabilizer and she agreed with the plan. I started her on 25 mg daily and discussed about the risk of rash including Trudie BucklerSteven Johnson syndrome and she demonstrated understanding Follow-up in 3 weeks or earlier   More than 50% of the time spent in psychoeducation, counseling and coordination of care.    This note was generated in part or whole with voice recognition software. Voice regonition is usually quite accurate but there are transcription errors that can and very often do occur. I apologize for any typographical errors that were not detected and corrected.   Brandy HaleUzma Kayleen Alig, MD  01/05/2016, 2:27 PM

## 2018-05-09 ENCOUNTER — Other Ambulatory Visit: Payer: Self-pay

## 2018-05-09 ENCOUNTER — Encounter: Payer: Self-pay | Admitting: *Deleted

## 2018-05-16 ENCOUNTER — Ambulatory Visit: Payer: BLUE CROSS/BLUE SHIELD | Admitting: Anesthesiology

## 2018-05-16 ENCOUNTER — Encounter
Admission: RE | Disposition: A | Discharge status: Home / Self Care | Payer: Self-pay | Source: Ambulatory Visit | Attending: Otolaryngology

## 2018-05-16 ENCOUNTER — Ambulatory Visit
Admission: RE | Admit: 2018-05-16 | Discharge: 2018-05-16 | Disposition: A | Discharge status: Home / Self Care | Payer: BLUE CROSS/BLUE SHIELD | Source: Ambulatory Visit | Attending: Otolaryngology | Admitting: Otolaryngology

## 2018-05-16 DIAGNOSIS — J343 Hypertrophy of nasal turbinates: Secondary | ICD-10-CM | POA: Diagnosis not present

## 2018-05-16 DIAGNOSIS — J0101 Acute recurrent maxillary sinusitis: Secondary | ICD-10-CM | POA: Insufficient documentation

## 2018-05-16 DIAGNOSIS — Z79899 Other long term (current) drug therapy: Secondary | ICD-10-CM | POA: Diagnosis not present

## 2018-05-16 DIAGNOSIS — Z87891 Personal history of nicotine dependence: Secondary | ICD-10-CM | POA: Insufficient documentation

## 2018-05-16 HISTORY — DX: Dizziness and giddiness: R42

## 2018-05-16 HISTORY — PX: TURBINATE REDUCTION: SHX6157

## 2018-05-16 HISTORY — PX: MAXILLARY ANTROSTOMY: SHX2003

## 2018-05-16 SURGERY — REDUCTION, NASAL TURBINATE
Anesthesia: General | Laterality: Bilateral

## 2018-05-16 MED ORDER — HYDROCODONE-ACETAMINOPHEN 5-325 MG PO TABS: 1.0000 | ORAL_TABLET | Freq: Four times a day (QID) | ORAL | 0 refills | Status: DC | PRN

## 2018-05-16 MED ORDER — ONDANSETRON HCL 4 MG PO TABS: 4.0000 mg | ORAL_TABLET | Freq: Three times a day (TID) | ORAL | 0 refills | Status: DC | PRN

## 2018-05-16 MED ORDER — LIDOCAINE-EPINEPHRINE 1 %-1:100000 IJ SOLN
INTRAMUSCULAR | Status: DC | PRN
  Administered 2018-05-16: 7 mL

## 2018-05-16 MED ORDER — AMOXICILLIN-POT CLAVULANATE 875-125 MG PO TABS: 1.0000 | ORAL_TABLET | Freq: Two times a day (BID) | ORAL | 0 refills | Status: DC

## 2018-05-16 MED ORDER — ACETAMINOPHEN 10 MG/ML IV SOLN
1000.0000 mg | Freq: Once | INTRAVENOUS | Status: AC
  Administered 2018-05-16: 1000 mg via INTRAVENOUS

## 2018-05-16 MED ORDER — OXYCODONE HCL 5 MG/5ML PO SOLN: 5.0000 mg | Freq: Once | ORAL | Status: AC | PRN

## 2018-05-16 MED ORDER — ACETAMINOPHEN 325 MG PO TABS: 325.0000 mg | ORAL_TABLET | ORAL | Status: DC | PRN

## 2018-05-16 MED ORDER — OXYCODONE HCL 5 MG PO TABS
5.0000 mg | ORAL_TABLET | Freq: Once | ORAL | Status: AC | PRN
  Administered 2018-05-16: 5 mg via ORAL

## 2018-05-16 MED ORDER — PROPOFOL 10 MG/ML IV BOLUS
INTRAVENOUS | Status: DC | PRN
  Administered 2018-05-16: 150 mg via INTRAVENOUS
  Administered 2018-05-16: 20 mg via INTRAVENOUS

## 2018-05-16 MED ORDER — LIDOCAINE HCL (CARDIAC) PF 100 MG/5ML IV SOSY
PREFILLED_SYRINGE | INTRAVENOUS | Status: DC | PRN
  Administered 2018-05-16: 40 mg via INTRAVENOUS

## 2018-05-16 MED ORDER — ONDANSETRON HCL 4 MG/2ML IJ SOLN: 4.0000 mg | Freq: Once | INTRAMUSCULAR | Status: DC | PRN

## 2018-05-16 MED ORDER — OXYMETAZOLINE HCL 0.05 % NA SOLN
NASAL | Status: DC | PRN
  Administered 2018-05-16: 1 via TOPICAL

## 2018-05-16 MED ORDER — FENTANYL CITRATE (PF) 100 MCG/2ML IJ SOLN
25.0000 ug | INTRAMUSCULAR | Status: DC | PRN
  Administered 2018-05-16: 25 ug via INTRAVENOUS

## 2018-05-16 MED ORDER — ONDANSETRON HCL 4 MG/2ML IJ SOLN
INTRAMUSCULAR | Status: DC | PRN
  Administered 2018-05-16: 4 mg via INTRAVENOUS

## 2018-05-16 MED ORDER — LACTATED RINGERS IV SOLN: 500.0000 mL | INTRAVENOUS | Status: DC

## 2018-05-16 MED ORDER — DEXAMETHASONE SODIUM PHOSPHATE 4 MG/ML IJ SOLN
INTRAMUSCULAR | Status: DC | PRN
  Administered 2018-05-16: 10 mg via INTRAVENOUS

## 2018-05-16 MED ORDER — MIDAZOLAM HCL 5 MG/5ML IJ SOLN
INTRAMUSCULAR | Status: DC | PRN
  Administered 2018-05-16: 2 mg via INTRAVENOUS

## 2018-05-16 MED ORDER — ROCURONIUM BROMIDE 100 MG/10ML IV SOLN
INTRAVENOUS | Status: DC | PRN
  Administered 2018-05-16: 15 mg via INTRAVENOUS

## 2018-05-16 MED ORDER — LACTATED RINGERS IV SOLN
INTRAVENOUS | Status: DC
  Administered 2018-05-16: 09:00:00 via INTRAVENOUS

## 2018-05-16 MED ORDER — GLYCOPYRROLATE 0.2 MG/ML IJ SOLN
INTRAMUSCULAR | Status: DC | PRN
  Administered 2018-05-16: 0.1 mg via INTRAVENOUS

## 2018-05-16 MED ORDER — DEXMEDETOMIDINE HCL 200 MCG/2ML IV SOLN
INTRAVENOUS | Status: DC | PRN
  Administered 2018-05-16: 10 ug via INTRAVENOUS

## 2018-05-16 MED ORDER — FENTANYL CITRATE (PF) 100 MCG/2ML IJ SOLN
INTRAMUSCULAR | Status: DC | PRN
  Administered 2018-05-16: 25 ug via INTRAVENOUS
  Administered 2018-05-16: 100 ug via INTRAVENOUS

## 2018-05-16 MED ORDER — ACETAMINOPHEN 160 MG/5ML PO SOLN: 325.0000 mg | ORAL | Status: DC | PRN

## 2018-05-16 SURGICAL SUPPLY — 22 items
BALLN SINUPLASTY KIT 6X16 (BALLOONS) ×3
BALLOON SINUPLASTY KIT 6X16 (BALLOONS) IMPLANT
BATTERY INSTRU NAVIGATION (MISCELLANEOUS) ×10 IMPLANT
BTRY SRG DRVR LF (MISCELLANEOUS) ×3
CANISTER SUCT 1200ML W/VALVE (MISCELLANEOUS) ×3 IMPLANT
COAG SUCT 10F 3.5MM HAND CTRL (MISCELLANEOUS) ×3 IMPLANT
DEVICE INFLATION SEID (MISCELLANEOUS) ×2 IMPLANT
DRESSING NASL FOAM PST OP SINU (MISCELLANEOUS) IMPLANT
DRSG NASAL FOAM POST OP SINU (MISCELLANEOUS) ×3
ELECT REM PT RETURN 9FT ADLT (ELECTROSURGICAL) ×3
ELECTRODE REM PT RTRN 9FT ADLT (ELECTROSURGICAL) ×1 IMPLANT
GLOVE BIO SURGEON STRL SZ7.5 (GLOVE) ×6 IMPLANT
KIT TURNOVER KIT A (KITS) ×3 IMPLANT
NS IRRIG 500ML POUR BTL (IV SOLUTION) ×3 IMPLANT
PACK ENT CUSTOM (PACKS) ×3 IMPLANT
PACKING NASAL EPIS 4X2.4 XEROG (MISCELLANEOUS) IMPLANT
PATTIES SURGICAL .5 X3 (DISPOSABLE) ×3 IMPLANT
SOL ANTI-FOG 6CC FOG-OUT (MISCELLANEOUS) ×1 IMPLANT
SOL FOG-OUT ANTI-FOG 6CC (MISCELLANEOUS) ×2
SYR 10ML LL (SYRINGE) ×3 IMPLANT
SYR BULB 3OZ (MISCELLANEOUS) ×3 IMPLANT
WATER STERILE IRR 250ML POUR (IV SOLUTION) ×2 IMPLANT

## 2018-05-16 NOTE — Anesthesia Procedure Notes (Signed)
Procedure Name: Intubation Date/Time: 05/16/2018 10:27 AM Performed by: Jimmy Picket, CRNA Pre-anesthesia Checklist: Patient identified, Emergency Drugs available, Suction available, Patient being monitored and Timeout performed Patient Re-evaluated:Patient Re-evaluated prior to induction Oxygen Delivery Method: Circle system utilized Preoxygenation: Pre-oxygenation with 100% oxygen Induction Type: IV induction Ventilation: Mask ventilation without difficulty Laryngoscope Size: Miller and 2 Grade View: Grade I Tube type: Oral Rae Tube size: 7.0 mm Number of attempts: 1 Placement Confirmation: ETT inserted through vocal cords under direct vision,  positive ETCO2 and breath sounds checked- equal and bilateral Tube secured with: Tape Dental Injury: Teeth and Oropharynx as per pre-operative assessment

## 2018-05-16 NOTE — Discharge Instructions (Signed)
[facility] °[facility] °ENDOSCOPIC SINUS SURGERY °[facility] ° °What is Functional Endoscopic Sinus Surgery? ° The Surgery involves making the natural openings of the sinuses larger by removing the bony partitions that separate the sinuses from the nasal cavity.  The natural sinus lining is preserved as much as possible to allow the sinuses to resume normal function after the surgery.  In some patients nasal polyps (excessively swollen lining of the sinuses) may be removed to relieve obstruction of the sinus openings.  The surgery is performed through the nose using lighted scopes, which eliminates the need for incisions on the face.  A septoplasty is a different procedure which is sometimes performed with sinus surgery.  It involves straightening the boy partition that separates the two sides of your nose.  A crooked or deviated septum may need repair if is obstructing the sinuses or nasal airflow.  Turbinate reduction is also often performed during sinus surgery.  The turbinates are bony proturberances from the side walls of the nose which swell and can obstruct the nose in patients with sinus and allergy problems.  Their size can be surgically reduced to help relieve nasal obstruction. ° °What Can Sinus Surgery Do For Me? ° Sinus surgery can reduce the frequency of sinus infections requiring antibiotic treatment.  This can provide improvement in nasal congestion, post-nasal drainage, facial pressure and nasal obstruction.  Surgery will NOT prevent you from ever having an infection again, so it usually only for patients who get infections 4 or more times yearly requiring antibiotics, or for infections that do not clear with antibiotics.  It will not cure nasal allergies, so patients with allergies may still require medication to treat their allergies after surgery. Surgery may improve headaches related to sinusitis, however, some people will continue to  require medication to control sinus headaches related to allergies.  Surgery will do nothing for other forms of headache (migraine, tension or cluster). ° °What Are the Risks of Endoscopic Sinus Surgery? ° Current techniques allow surgery to be performed safely with little risk, however, there are rare complications that patients should be aware of.  Because the sinuses are located around the eyes, there is risk of eye injury, including blindness, though again, this would be quite rare. This is usually a result of bleeding behind the eye during surgery, which puts the vision oat risk, though there are treatments to protect the vision and prevent permanent disrupted by surgery causing a leak of the spinal fluid that surrounds the brain.  More serious complications would include bleeding inside the brain cavity or damage to the brain.  Again, all of these complications are uncommon, and spinal fluid leaks can be safely managed surgically if they occur.  The most common complication of sinus surgery is bleeding from the nose, which may require packing or cauterization of the nose.  Continued sinus have polyps may experience recurrence of the polyps requiring revision surgery.  Alterations of sense of smell or injury to the tear ducts are also rare complications.  ° °What is the Surgery Like, and what is the Recovery? ° The Surgery usually takes a couple of hours to perform, and is usually performed under a general anesthetic (completely asleep).  Patients are usually discharged home after a couple of hours.  Sometimes during surgery it is necessary to pack the nose to control bleeding, and the packing is left in place for 24 - 48 hours, and removed by your surgeon.    If a septoplasty was performed during the procedure, there is often a splint placed which must be removed after 5-7 days.   °Discomfort: Pain is usually mild to moderate, and can be controlled by prescription pain medication or acetaminophen (Tylenol).   Aspirin, Ibuprofen (Advil, Motrin), or Naprosyn (Aleve) should be avoided, as they can cause increased bleeding.  Most patients feel sinus pressure like they have a bad head cold for several days.  Sleeping with your head elevated can help reduce swelling and facial pressure, as can ice packs over the face.  A humidifier may be helpful to keep the mucous and blood from drying in the nose.  ° °Diet: There are no specific diet restrictions, however, you should generally start with clear liquids and a light diet of bland foods because the anesthetic can cause some nausea.  Advance your diet depending on how your stomach feels.  Taking your pain medication with food will often help reduce stomach upset which pain medications can cause. ° °Nasal Saline Irrigation: It is important to remove blood clots and dried mucous from the nose as it is healing.  This is done by having you irrigate the nose at least 3 - 4 times daily with a salt water solution.  We recommend using NeilMed Sinus Rinse (available at the drug store).  Fill the squeeze bottle with the solution, bend over a sink, and insert the tip of the squeeze bottle into the nose ½ of an inch.  Point the tip of the squeeze bottle towards the inside corner of the eye on the same side your irrigating.  Squeeze the bottle and gently irrigate the nose.  If you bend forward as you do this, most of the fluid will flow back out of the nose, instead of down your throat.   The solution should be warm, near body temperature, when you irrigate.   Each time you irrigate, you should use a full squeeze bottle.  ° °Note that if you are instructed to use Nasal Steroid Sprays at any time after your surgery, irrigate with saline BEFORE using the steroid spray, so you do not wash it all out of the nose. °Another product, Nasal Saline Gel (such as AYR Nasal Saline Gel) can be applied in each nostril 3 - 4 times daily to moisture the nose and reduce scabbing or crusting. ° °Bleeding:   Bloody drainage from the nose can be expected for several days, and patients are instructed to irrigate their nose frequently with salt water to help remove mucous and blood clots.  The drainage may be dark red or brown, though some fresh blood may be seen intermittently, especially after irrigation.  Do not blow you nose, as bleeding may occur. If you must sneeze, keep your mouth open to allow air to escape through your mouth. ° °If heavy bleeding occurs: Irrigate the nose with saline to rinse out clots, then spray the nose 3 - 4 times with Afrin Nasal Decongestant Spray.  The spray will constrict the blood vessels to slow bleeding.  Pinch the lower half of your nose shut to apply pressure, and lay down with your head elevated.  Ice packs over the nose may help as well. If bleeding persists despite these measures, you should notify your doctor.  Do not use the Afrin routinely to control nasal congestion after surgery, as it can result in worsening congestion and may affect healing.  ° ° ° °Activity: Return to work varies among patients. Most patients will be   out of work at least 5 - 7 days to recover.  Patient may return to work after they are off of narcotic pain medication, and feeling well enough to perform the functions of their job.  Patients must avoid heavy lifting (over 10 pounds) or strenuous physical for 2 weeks after surgery, so your employer may need to assign you to light duty, or keep you out of work longer if light duty is not possible.  NOTE: you should not drive, operate dangerous machinery, do any mentally demanding tasks or make any important legal or financial decisions while on narcotic pain medication and recovering from the general anesthetic.  °  °Call Your Doctor Immediately if You Have Any of the Following: °1. Bleeding that you cannot control with the above measures °2. Loss of vision, double vision, bulging of the eye or black eyes. °3. Fever over 101 degrees °4. Neck stiffness with  severe headache, fever, nausea and change in mental state. °You are always encourage to call anytime with concerns, however, please call with requests for pain medication refills during office hours. ° °Office Endoscopy: During follow-up visits your doctor will remove any packing or splints that may have been placed and evaluate and clean your sinuses endoscopically.  Topical anesthetic will be used to make this as comfortable as possible, though you may want to take your pain medication prior to the visit.  How often this will need to be done varies from patient to patient.  After complete recovery from the surgery, you may need follow-up endoscopy from time to time, particularly if there is concern of recurrent infection or nasal polyps. ° ° °General Anesthesia, Adult, Care After °These instructions provide you with information about caring for yourself after your procedure. Your health care provider may also give you more specific instructions. Your treatment has been planned according to current medical practices, but problems sometimes occur. Call your health care provider if you have any problems or questions after your procedure. °What can I expect after the procedure? °After the procedure, it is common to have: °· Vomiting. °· A sore throat. °· Mental slowness. ° °It is common to feel: °· Nauseous. °· Cold or shivery. °· Sleepy. °· Tired. °· Sore or achy, even in parts of your body where you did not have surgery. ° °Follow these instructions at home: °For at least 24 hours after the procedure: °· Do not: °? Participate in activities where you could fall or become injured. °? Drive. °? Use heavy machinery. °? Drink alcohol. °? Take sleeping pills or medicines that cause drowsiness. °? Make important decisions or sign legal documents. °? Take care of children on your own. °· Rest. °Eating and drinking °· If you vomit, drink water, juice, or soup when you can drink without vomiting. °· Drink enough fluid to  keep your urine clear or pale yellow. °· Make sure you have little or no nausea before eating solid foods. °· Follow the diet recommended by your health care provider. °General instructions °· Have a responsible adult stay with you until you are awake and alert. °· Return to your normal activities as told by your health care provider. Ask your health care provider what activities are safe for you. °· Take over-the-counter and prescription medicines only as told by your health care provider. °· If you smoke, do not smoke without supervision. °· Keep all follow-up visits as told by your health care provider. This is important. °Contact a health care provider if: °· You   continue to have nausea or vomiting at home, and medicines are not helpful. °· You cannot drink fluids or start eating again. °· You cannot urinate after 8-12 hours. °· You develop a skin rash. °· You have fever. °· You have increasing redness at the site of your procedure. °Get help right away if: °· You have difficulty breathing. °· You have chest pain. °· You have unexpected bleeding. °· You feel that you are having a life-threatening or urgent problem. °This information is not intended to replace advice given to you by your health care provider. Make sure you discuss any questions you have with your health care provider. °Document Released: 10/10/2000 Document Revised: 12/07/2015 Document Reviewed: 06/18/2015 °Elsevier Interactive Patient Education © 2018 [employer] ° °

## 2018-05-16 NOTE — Anesthesia Postprocedure Evaluation (Signed)
Anesthesia Post Note  Patient: Linda Hubbard  Procedure(s) Performed: INFERIOR TURBINATE REDUCTION (Bilateral ) BALLOON SINUPLASTY OF MAXILLARY SINUSES (Bilateral )  Patient location during evaluation: PACU Anesthesia Type: General Level of consciousness: awake and alert, oriented and patient cooperative Pain management: pain level controlled Vital Signs Assessment: post-procedure vital signs reviewed and stable Respiratory status: spontaneous breathing, nonlabored ventilation and respiratory function stable Cardiovascular status: blood pressure returned to baseline and stable Postop Assessment: adequate PO intake Anesthetic complications: no    Reed Breech

## 2018-05-16 NOTE — Transfer of Care (Signed)
Immediate Anesthesia Transfer of Care Note  Patient: Linda Hubbard  Procedure(s) Performed: INFERIOR TURBINATE REDUCTION (Bilateral ) BALLOON SINUPLASTY OF MAXILLARY SINUSES (Bilateral )  Patient Location: PACU  Anesthesia Type: General  Level of Consciousness: awake, alert  and patient cooperative  Airway and Oxygen Therapy: Patient Spontanous Breathing and Patient connected to supplemental oxygen  Post-op Assessment: Post-op Vital signs reviewed, Patient's Cardiovascular Status Stable, Respiratory Function Stable, Patent Airway and No signs of Nausea or vomiting  Post-op Vital Signs: Reviewed and stable  Complications: No apparent anesthesia complications

## 2018-05-16 NOTE — Anesthesia Preprocedure Evaluation (Signed)
Anesthesia Evaluation  Patient identified by MRN, date of birth, ID band Patient awake    Reviewed: Allergy & Precautions, NPO status , Patient's Chart, lab work & pertinent test results  History of Anesthesia Complications Negative for: history of anesthetic complications  Airway Mallampati: I  TM Distance: >3 FB Neck ROM: Full    Dental  (+)    Pulmonary former smoker (quit 2014),    Pulmonary exam normal breath sounds clear to auscultation       Cardiovascular Exercise Tolerance: Good negative cardio ROS Normal cardiovascular exam Rhythm:Regular Rate:Normal     Neuro/Psych PSYCHIATRIC DISORDERS (ADHD) Anxiety Depression Vertigo     GI/Hepatic negative GI ROS,   Endo/Other  negative endocrine ROS  Renal/GU negative Renal ROS     Musculoskeletal   Abdominal   Peds  Hematology negative hematology ROS (+)   Anesthesia Other Findings   Reproductive/Obstetrics                            Anesthesia Physical Anesthesia Plan  ASA: II  Anesthesia Plan: General   Post-op Pain Management:    Induction: Intravenous  PONV Risk Score and Plan: 3 and Ondansetron and Dexamethasone  Airway Management Planned: Oral ETT  Additional Equipment:   Intra-op Plan:   Post-operative Plan: Extubation in OR  Informed Consent: I have reviewed the patients History and Physical, chart, labs and discussed the procedure including the risks, benefits and alternatives for the proposed anesthesia with the patient or authorized representative who has indicated his/her understanding and acceptance.     Plan Discussed with: CRNA  Anesthesia Plan Comments:         Anesthesia Quick Evaluation

## 2018-05-16 NOTE — H&P (Signed)
..  History and Physical paper copy reviewed and updated date of procedure and will be scanned into system.  Patient seen and examined.  

## 2018-05-16 NOTE — Op Note (Signed)
..  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  Bilateral significant inferior turbinate hypertrophy of bone and soft tissue reduced.  Procedure: With the patient in a comfortable supine position,  general orotracheal anesthesia was induced without difficulty.  The patient received preoperative Afrin spray for topical decongestion and vasoconstriction.  At an appropriate level, the patient was placed in a semi-sitting position.  Nasal vibrissae were trimmed.   1% Xylocaine with 1:100,000 epinephrine, 5 cc's, was infiltrated into the anterior floor of the nose, into the nasal spine region, into the membranous columella, and bilateral inferior and middle turbinates..  Several minutes were allowed for this to take effect.  Cottoniod pledgetts soaked in Afrin were placed into both nasal cavities and left while the patient was prepped and draped in the standard fashion.  The materials were removed from the nose and observed to be intact and correct in number.  The nose was inspected with a headlight and zero degree endoscope with the findings as described above.  The inferior turbinates were then inspected.  Under endoscopic visualization, the inferior turbinates were infractured bilaterally with a Therapist, nutritional.  A kelly clamp was attached to the anterior-inferior third of each inferior turbinate for approximately one minute.  Under endoscopic visualization, Tru-cutting forceps were used to remove the anterior-inferior third of each inferior turbinate.  Electrocautery was used to control  bleeding in the area. The remaining turbinate was then outfractured to open up the airway further. There was no significant bleeding noted. The right turbinate was then trimmed and outfractured in a similar fashion.  The airways were then visualized and showed open passageways on both sides that were significantly improved compared to before surgery.       At this point, attention was directed to the functional endoscopic sinus surgery aspect of the procedure.  The nose was next inspected with a zero degree endoscope and the middle turbinates were medialized and afrin soaked pledgets were placed lateral to the turbinates for approximately one minute.  The uncinate process was infractured with a maxillary ostia seeker.  The Acclarent balloon sinuplasty device was next placed behind the uncinate process and the guide wire was threaded into the maxillary sinus with proper transillumination.  The sinus tract was then dilated 3X for a patent dilated ostia.  Visualization of the uncinate process being brought forward by the balloon device was made.  This was repeated on the patient's right side as well in a similar fashion.  The patient's nasal cavities were cleaned with suction and Stamberger sinufoam was placed along the cut edge of the inferior turbinates and adjacent to the dilated maxillary ostia bilaterally.  Care of the patient was transferred to anesthesia where the patient was extubated and taken to PACU in good condition.  Dispo:   PACU to home  Plan: Ice, elevation, narcotic analgesia, and prophylactic antibiotics.  We will re-evaluate the patient in the office in 7 days.  Return to work in 10 days, strenuous activities in two weeks.   Pankaj Haack 05/16/2018 11:04 AM

## 2018-05-17 ENCOUNTER — Encounter: Payer: Self-pay | Admitting: Otolaryngology

## 2019-04-23 ENCOUNTER — Other Ambulatory Visit: Payer: Self-pay

## 2019-04-23 DIAGNOSIS — Z20822 Contact with and (suspected) exposure to covid-19: Secondary | ICD-10-CM

## 2019-04-26 LAB — NOVEL CORONAVIRUS, NAA: SARS-CoV-2, NAA: NOT DETECTED

## 2019-07-10 ENCOUNTER — Ambulatory Visit: Payer: Self-pay | Admitting: Women's Health

## 2020-01-22 ENCOUNTER — Encounter: Payer: Self-pay | Admitting: *Deleted

## 2020-01-22 ENCOUNTER — Emergency Department
Admission: EM | Admit: 2020-01-22 | Discharge: 2020-01-22 | Disposition: A | Discharge status: Home / Self Care | Payer: BC Managed Care – PPO | Attending: Emergency Medicine | Admitting: Emergency Medicine

## 2020-01-22 ENCOUNTER — Other Ambulatory Visit: Payer: Self-pay

## 2020-01-22 ENCOUNTER — Emergency Department: Payer: BC Managed Care – PPO

## 2020-01-22 DIAGNOSIS — R0602 Shortness of breath: Secondary | ICD-10-CM | POA: Insufficient documentation

## 2020-01-22 DIAGNOSIS — Z87891 Personal history of nicotine dependence: Secondary | ICD-10-CM | POA: Diagnosis not present

## 2020-01-22 DIAGNOSIS — R079 Chest pain, unspecified: Secondary | ICD-10-CM

## 2020-01-22 LAB — CBC
HCT: 45.8 % (ref 36.0–46.0)
Hemoglobin: 16.3 g/dL — ABNORMAL HIGH (ref 12.0–15.0)
MCH: 31 pg (ref 26.0–34.0)
MCHC: 35.6 g/dL (ref 30.0–36.0)
MCV: 87.1 fL (ref 80.0–100.0)
Platelets: 284 10*3/uL (ref 150–400)
RBC: 5.26 MIL/uL — ABNORMAL HIGH (ref 3.87–5.11)
RDW: 12.5 % (ref 11.5–15.5)
WBC: 9.6 10*3/uL (ref 4.0–10.5)
nRBC: 0 % (ref 0.0–0.2)

## 2020-01-22 LAB — BASIC METABOLIC PANEL
Anion gap: 7 (ref 5–15)
BUN: 12 mg/dL (ref 6–20)
CO2: 26 mmol/L (ref 22–32)
Calcium: 8.9 mg/dL (ref 8.9–10.3)
Chloride: 107 mmol/L (ref 98–111)
Creatinine, Ser: 0.65 mg/dL (ref 0.44–1.00)
GFR calc Af Amer: >60 mL/min (ref >60)
GFR calc non Af Amer: >60 mL/min (ref >60)
Glucose, Bld: 94 mg/dL (ref 70–99)
Potassium: 4.1 mmol/L (ref 3.5–5.1)
Sodium: 140 mmol/L (ref 135–145)

## 2020-01-22 LAB — TROPONIN I (HIGH SENSITIVITY)
Troponin I (High Sensitivity): 6 ng/L (ref <18)
Troponin I (High Sensitivity): 6 ng/L (ref <18)

## 2020-01-22 LAB — FIBRIN DERIVATIVES D-DIMER (ARMC ONLY): Fibrin derivatives D-dimer (ARMC): 300.73 ng/mL (FEU) (ref 0.00–499.00)

## 2020-01-22 MED ORDER — SODIUM CHLORIDE 0.9% FLUSH: 3.0000 mL | Freq: Once | INTRAVENOUS | Status: DC

## 2020-01-22 NOTE — ED Triage Notes (Signed)
Patient c/o left-sided chest pain that began around 1230 today. Patient states pain radiated to back.

## 2020-01-22 NOTE — Discharge Instructions (Addendum)
Your EKG, chest x-ray, heart blood work and the blood work looking for blood clot are all okay.  Because your having a chest pain though I will have you follow-up with a cardiologist.  Dr. Lady Gary is on-call for [facility].  Please call his office and arrange follow-up.  They should be on to get you in much sooner than August 2 when your physical is due.  Dr. Lady Gary can also keep an eye on the blood pressure for the time being.  It may be that if we cannot get the blood pressure down through diet and exercise he will have to start on something for that.  Please return for increasing chest pain shortness of breath fever or any other problems.

## 2020-01-22 NOTE — ED Provider Notes (Signed)
[facility] Emergency Department Provider Note   ____________________________________________   First MD Initiated Contact with Patient 01/22/20 1623     (approximate)  I have reviewed the triage vital signs and the nursing notes.   HISTORY  Chief Complaint Chest Pain    HPI Linda Hubbard is a 54 y.o. female who reports chest pain in the anterior chest just medial to the breast going under the breast and through to the back.  It is localized.  It is made worse by deep breathing and movement.  She has had it before but it is always gone away fairly quickly.  Today it did not go away quickly so she came into the emergency room.  Pain is moderate been going on since about noon.  No fever chills or coughing.  She does feel slightly short of breath.  She says she is heavier than she was when she had the pain last time.        Past Medical History:  Diagnosis Date  . ADHD (attention deficit hyperactivity disorder)   . Anxiety   . Depression   . Vertigo    1-2x/week    Patient Active Problem List   Diagnosis Date Noted  . Suicide attempt by drug ingestion (HCC)   . MDD (major depressive disorder) 09/18/2015  . Overdose 09/16/2015  . Suicidal behavior 09/16/2015  . Hypothermia 09/16/2015    Past Surgical History:  Procedure Laterality Date  . ABDOMINAL HYSTERECTOMY    . CYST EXCISION  03/23/2005   Tongue.  Dr Chestine Spore, [facility]  . DENTAL SURGERY    . MAXILLARY ANTROSTOMY Bilateral 05/16/2018   Procedure: BALLOON SINUPLASTY OF MAXILLARY SINUSES;  Surgeon: Bud Face, MD;  Location: [facility];  Service: ENT;  Laterality: Bilateral;  . TONSILLECTOMY    . TURBINATE REDUCTION Bilateral 05/16/2018   Procedure: INFERIOR TURBINATE REDUCTION;  Surgeon: Bud Face, MD;  Location: [facility];  Service: ENT;  Laterality: Bilateral;    Prior to Admission medications   Medication Sig Start Date End Date Taking? Authorizing  Provider  amoxicillin-clavulanate (AUGMENTIN) 875-125 MG tablet Take 1 tablet by mouth 2 (two) times daily. 05/16/18   Bud Face, MD  diphenhydrAMINE (BENADRYL) 25 MG tablet Take 25 mg by mouth every 6 (six) hours as needed.    [provider]  HYDROcodone-acetaminophen (NORCO) 5-325 MG tablet Take 1 tablet by mouth every 6 (six) hours as needed for moderate pain. 05/16/18   Vaught, Roney Mans, MD  montelukast (SINGULAIR) 10 MG tablet Take 10 mg by mouth at bedtime.    [provider]  ondansetron (ZOFRAN) 4 MG tablet Take 1 tablet (4 mg total) by mouth every 8 (eight) hours as needed for up to 10 doses for nausea or vomiting. 05/16/18   Bud Face, MD  traZODone (DESYREL) 100 MG tablet Take 50 mg by mouth at bedtime as needed for sleep.    [provider]  zolpidem (AMBIEN) 5 MG tablet Take 5 mg by mouth at bedtime as needed for sleep.    [provider]    Allergies Poison oak extract  Family History  Problem Relation Age of Onset  . Breast cancer Mother   . Hypertension Sister   . Alcohol abuse Maternal Grandfather   . Diabetes Maternal Grandmother   . Atrial fibrillation Maternal Grandmother     Social History Social History   Tobacco Use  . Smoking status: Former Smoker    Packs/day: 1.00  Types: Cigarettes    Start date: 02/18/1985    Quit date: 02/18/2013    Years since quitting: 6.9  . Smokeless tobacco: Never Used  Vaping Use  . Vaping Use: Never used  Substance Use Topics  . Alcohol use: No    Alcohol/week: 0.0 standard drinks    Comment: may have drink 2x/year  . Drug use: No    Review of Systems  Constitutional: No fever/chills Eyes: No visual changes. ENT: No sore throat. Cardiovascular:  chest pain. Respiratory: Some shortness of breath. Gastrointestinal: No abdominal pain.  No nausea, no vomiting.  No diarrhea.  No constipation. Genitourinary: Negative for dysuria. Musculoskeletal: Negative for back  pain. Skin: Negative for rash. Neurological: Negative for headaches, focal weakness ____________________________________________   PHYSICAL EXAM:  VITAL SIGNS: ED Triage Vitals  Enc Vitals Group     BP 01/22/20 1429 (!) 181/95     Pulse Rate 01/22/20 1429 62     Resp 01/22/20 1429 18     Temp 01/22/20 1429 97.8 F (36.6 C)     Temp Source 01/22/20 1429 Oral     SpO2 01/22/20 1429 98 %     Weight 01/22/20 1432 175 lb (79.4 kg)     Height 01/22/20 1432 5\' 3"  (1.6 m)     Head Circumference --      Peak Flow --      Pain Score 01/22/20 1428 4     Pain Loc --      Pain Edu? --      Excl. in GC? --     Constitutional: Alert and oriented. Well appearing and in no acute distress. Eyes: Conjunctivae are normal.  Head: Atraumatic. Nose: No congestion/rhinnorhea. Mouth/Throat: Mucous membranes are moist.  Oropharynx non-erythematous. Neck: No stridor.   Cardiovascular: Normal rate, regular rhythm. Grossly normal heart sounds.  Good peripheral circulation. Respiratory: Normal respiratory effort.  No retractions. Lungs CTAB. Gastrointestinal: Soft and nontender. No distention. No abdominal bruits. No CVA tenderness. Musculoskeletal: No lower extremity tenderness nor edema.  Neurologic:  Normal speech and language. No gross focal neurologic deficits are appreciated. No gait instability. Skin:  Skin is warm, dry and intact. No rash noted.   ____________________________________________   LABS (all labs ordered are listed, but only abnormal results are displayed)  Labs Reviewed  CBC - Abnormal; Notable for the following components:      Result Value   RBC 5.26 (*)    Hemoglobin 16.3 (*)    All other components within normal limits  BASIC METABOLIC PANEL  FIBRIN DERIVATIVES D-DIMER ([facility] ONLY)  TROPONIN I (HIGH SENSITIVITY)  TROPONIN I (HIGH SENSITIVITY)   ____________________________________________  EKG  EKG read interpreted by me shows normal sinus rhythm rate of 61  normal axis essentially normal EKG ____________________________________________  RADIOLOGY  ED MD interpretation: Chest x-ray read by radiology reviewed by me shows only some mild left-sided atelectasis.  Official radiology report(s): DG Chest 2 View  Result Date: 01/22/2020 CLINICAL DATA:  54 year old female with left-sided chest pain. EXAM: CHEST - 2 VIEW COMPARISON:  Chest CT dated 04/12/2008. FINDINGS: Minimal left lung base atelectasis. No focal consolidation, pleural effusion, or pneumothorax. Cardiac silhouette is within limits. No acute osseous pathology. IMPRESSION: No active cardiopulmonary disease. Electronically Signed   By: 04/14/2008 M.D.   On: 01/22/2020 15:20    ____________________________________________   PROCEDURES  Procedure(s) performed (including Critical Care):  Procedures   ____________________________________________   INITIAL IMPRESSION / ASSESSMENT AND PLAN / ED COURSE  Patient  with no history of family history of cardiac disease except for A. fib and family.  Pain is worse with deep breathing or movement.  Much less likely to be cardiac.  EKG is normal initial troponin is normal hopefully second troponin will be normal as well I am going to send a D-dimer.    ----------------------------------------- 5:40 PM on 01/22/2020 -----------------------------------------  Second troponin and D-dimer are negative.  Patient's blood pressure is coming down somewhat.  I will have her follow-up with Dr. Lady Gary cardiology quickly and then I instructed her on diet and exercise which she can start after she sees Dr. Lady Gary.  Follow-up with her regular doctor for her regular physical on August 2.  I told her that if she is able to with diet and exercise drop 10 pounds or so she likely will not need much in the way blood pressure medication possibly not anything in the way of blood pressure medication.           ____________________________________________   FINAL CLINICAL IMPRESSION(S) / ED DIAGNOSES  Final diagnoses:  Chest pain, unspecified type     ED Discharge Orders    None       Note:  This document was prepared using Dragon voice recognition software and may include unintentional dictation errors.    Arnaldo Natal, MD 01/22/20 [phone]

## 2020-01-22 NOTE — ED Notes (Signed)
Pt verbalized discharge instructions and has no questions at this time 

## 2020-08-28 ENCOUNTER — Other Ambulatory Visit (HOSPITAL_COMMUNITY)
Admission: RE | Admit: 2020-08-28 | Discharge: 2020-08-28 | Disposition: A | Discharge status: Home / Self Care | Payer: BC Managed Care – PPO | Source: Ambulatory Visit | Attending: Nurse Practitioner | Admitting: Nurse Practitioner

## 2020-08-28 ENCOUNTER — Other Ambulatory Visit: Payer: Self-pay

## 2020-08-28 ENCOUNTER — Encounter: Payer: Self-pay | Admitting: Nurse Practitioner

## 2020-08-28 ENCOUNTER — Ambulatory Visit: Payer: BC Managed Care – PPO | Admitting: Nurse Practitioner

## 2020-08-28 VITALS — Ht 63.25 in | Wt 195.0 lb

## 2020-08-28 DIAGNOSIS — N951 Menopausal and female climacteric states: Secondary | ICD-10-CM

## 2020-08-28 DIAGNOSIS — R3914 Feeling of incomplete bladder emptying: Secondary | ICD-10-CM

## 2020-08-28 DIAGNOSIS — Z13220 Encounter for screening for lipoid disorders: Secondary | ICD-10-CM

## 2020-08-28 DIAGNOSIS — R5383 Other fatigue: Secondary | ICD-10-CM | POA: Diagnosis not present

## 2020-08-28 DIAGNOSIS — B009 Herpesviral infection, unspecified: Secondary | ICD-10-CM | POA: Diagnosis not present

## 2020-08-28 DIAGNOSIS — Z01419 Encounter for gynecological examination (general) (routine) without abnormal findings: Secondary | ICD-10-CM | POA: Diagnosis not present

## 2020-08-28 DIAGNOSIS — R350 Frequency of micturition: Secondary | ICD-10-CM

## 2020-08-28 LAB — COMPREHENSIVE METABOLIC PANEL
AG Ratio: 1.6 (calc) (ref 1.0–2.5)
ALT: 40 U/L — ABNORMAL HIGH (ref 6–29)
AST: 26 U/L (ref 10–35)
Albumin: 4.3 g/dL (ref 3.6–5.1)
Alkaline phosphatase (APISO): 112 U/L (ref 37–153)
BUN: 16 mg/dL (ref 7–25)
CO2: 28 mmol/L (ref 20–32)
Calcium: 9.9 mg/dL (ref 8.6–10.4)
Chloride: 101 mmol/L (ref 98–110)
Creat: 0.71 mg/dL (ref 0.50–1.05)
Globulin: 2.7 g/dL (calc) (ref 1.9–3.7)
Glucose, Bld: 88 mg/dL (ref 65–99)
Potassium: 4.1 mmol/L (ref 3.5–5.3)
Sodium: 139 mmol/L (ref 135–146)
Total Bilirubin: 0.5 mg/dL (ref 0.2–1.2)
Total Protein: 7 g/dL (ref 6.1–8.1)

## 2020-08-28 LAB — LIPID PANEL
Cholesterol: 265 mg/dL — ABNORMAL HIGH (ref <200)
HDL: 55 mg/dL (ref >=50)
LDL Cholesterol (Calc): 177 mg/dL (calc) — ABNORMAL HIGH
Non-HDL Cholesterol (Calc): 210 mg/dL (calc) — ABNORMAL HIGH (ref <130)
Total CHOL/HDL Ratio: 4.8 (calc) (ref <5.0)
Triglycerides: 180 mg/dL — ABNORMAL HIGH (ref <150)

## 2020-08-28 LAB — CBC
HCT: 48 % — ABNORMAL HIGH (ref 35.0–45.0)
Hemoglobin: 16.7 g/dL — ABNORMAL HIGH (ref 11.7–15.5)
MCH: 31.3 pg (ref 27.0–33.0)
MCHC: 34.8 g/dL (ref 32.0–36.0)
MCV: 89.9 fL (ref 80.0–100.0)
MPV: 10.9 fL (ref 7.5–12.5)
Platelets: 348 10*3/uL (ref 140–400)
RBC: 5.34 10*6/uL — ABNORMAL HIGH (ref 3.80–5.10)
RDW: 12.6 % (ref 11.0–15.0)
WBC: 9.8 10*3/uL (ref 3.8–10.8)

## 2020-08-28 LAB — TSH: TSH: 1.6 mIU/L

## 2020-08-28 MED ORDER — VALACYCLOVIR HCL 1 G PO TABS: 1000.0000 mg | ORAL_TABLET | Freq: Two times a day (BID) | ORAL | 3 refills | Status: DC

## 2020-08-28 MED ORDER — ESTRADIOL 0.05 MG/24HR TD PTTW: 1.0000 | MEDICATED_PATCH | TRANSDERMAL | 12 refills | Status: DC

## 2020-08-28 NOTE — Patient Instructions (Signed)

## 2020-08-28 NOTE — Progress Notes (Signed)
55 y.o. [id number] Married White or Caucasian female here for annual exam.    Experiencing a urinary frequency, does not have pain but experiencing a "twinge". Doesn't feel like she isn't finishing when she is urinating. Goes to bathroom (urination) about 12 times, including 2 times at night.. Rarely has stress incontinence Does have urgency but no urge incontinence Does not have pressure in pelvis, denies pain in pelvis "maybe a spasm in urethra"    Hx genital and oral HSV. Has oral outbreaks 6-8 per year, especially in summer. Would like rx of Valtrex. She has been on it in the past. PCP states wants GYN to prescribe. Feels so hot all the time, no energy, loss of joy, angry at times, feels like having menopausal symptoms.  No LMP recorded. Patient has had a hysterectomy.          Sexually active: No.  The current method of family planning is status post hysterectomy.    Exercising: No.  exercise Smoker:  no  Health Maintenance: Pap:  Unsure. Collected today, pt has a cervix History of abnormal Pap:  no MMG:  06-07-2019 neg Colonoscopy:  Scheduled for 09-11-2019 BMD:   Not done TDaP:  2020 Gardasil:   n/a Covid-19: pfizer Hep C testing: none Screening Labs:Did not have drawn with PCP this year, would like them done   reports that she quit smoking about 7 years ago. Her smoking use included cigarettes. She started smoking about 35 years ago. She has never used smokeless tobacco. She reports that she does not drink alcohol and does not use drugs.  Past Medical History:  Diagnosis Date  . ADHD (attention deficit hyperactivity disorder)   . Anxiety   . Depression   . STD (sexually transmitted disease)    HSV  . Vertigo    1-2x/week    Past Surgical History:  Procedure Laterality Date  . ABDOMINAL HYSTERECTOMY    . CYST EXCISION  03/23/2005   Tongue.  Dr Carlis Abbott, [facility]  . DENTAL SURGERY    . MAXILLARY ANTROSTOMY Bilateral 05/16/2018   Procedure: BALLOON SINUPLASTY OF MAXILLARY  SINUSES;  Surgeon: Carloyn Manner, MD;  Location: [place];  Service: ENT;  Laterality: Bilateral;  . TONSILLECTOMY    . TURBINATE REDUCTION Bilateral 05/16/2018   Procedure: INFERIOR TURBINATE REDUCTION;  Surgeon: Carloyn Manner, MD;  Location: [place];  Service: ENT;  Laterality: Bilateral;    Current Outpatient Medications  Medication Sig Dispense Refill  . amphetamine-dextroamphetamine (ADDERALL) 20 MG tablet Take by mouth.    . diphenhydrAMINE (BENADRYL) 25 MG tablet Take 25 mg by mouth every 6 (six) hours as needed.    Derrill Memo ON 08/31/2020] estradiol (VIVELLE-DOT) 0.05 MG/24HR patch Place 1 patch (0.05 mg total) onto the skin 2 (two) times a week. 8 patch 12  . FLUoxetine (PROZAC) 20 MG capsule Take 20 mg by mouth daily.    . montelukast (SINGULAIR) 10 MG tablet Take 10 mg by mouth at bedtime.    . traZODone (DESYREL) 100 MG tablet Take 50 mg by mouth at bedtime as needed for sleep.    . valACYclovir (VALTREX) 1000 MG tablet Take 1 tablet (1,000 mg total) by mouth 2 (two) times daily. 30 tablet 3   No current facility-administered medications for this visit.    Family History  Problem Relation Age of Onset  . Breast cancer Mother   . Breast cancer Sister   . Alcohol abuse Maternal Grandfather   . Diabetes Maternal Grandmother   .  Atrial fibrillation Maternal Grandmother   . Hypertension Sister   . Heart attack Father     Review of Systems  Constitutional: Negative.   HENT: Negative.   Eyes: Negative.   Respiratory: Negative.   Cardiovascular: Negative.   Gastrointestinal: Negative.   Endocrine: Negative.   Genitourinary:       Twinge with urination, trouble emptying bladder  Musculoskeletal: Negative.   Skin: Negative.   Allergic/Immunologic: Negative.   Neurological: Negative.   Hematological: Negative.   Psychiatric/Behavioral: Negative.     Exam:   Ht 5' 3.25" (1.607 m)   Wt 195 lb (88.5 kg)   BMI 34.27 kg/m   Height: 5' 3.25"  (160.7 cm)  General appearance: alert, cooperative and appears stated age, no acute distress Head: Normocephalic, without obvious abnormality Neck: no adenopathy, thyroid normal to inspection and palpation Lungs: clear to auscultation bilaterally Breasts: No axillary or supraclavicular adenopathy, Normal to palpation without dominant masses implants Heart: regular rate and rhythm Abdomen: soft, non-tender; no masses,  no organomegaly Extremities: extremities normal, no edema Skin: No rashes or lesions Lymph nodes: Cervical, supraclavicular, and axillary nodes normal. No abnormal inguinal nodes palpated Neurologic: Grossly normal   Pelvic: External genitalia:  no lesions              Urethra:  normal appearing urethra with no masses, tenderness or lesions              Bartholins and Skenes: normal                 Vagina: normal appearing vagina, appropriate for age, normal appearing discharge, no lesions              Cervix: CERVIX PRESENT, supracervical hyst             Bimanual Exam:   Uterus:  uterus absent              Adnexa: no mass, fullness, tenderness               Rectal: deferred per pt request (having colonoscopy in 2 weeks)  Joy, CMA Chaperone was present for exam.  A/P:    Well woman exam - Plan: Cytology - PAP( )  Feeling of incomplete bladder emptying - Plan: Urinalysis,Complete w/RFL Culture  HSV infection - Plan: valACYclovir (VALTREX) 1000 MG tablet  Visit for gynecologic examination - Plan: Lipid Profile, CBC, TSH, Comp Met (CMET)  Fatigue, unspecified type - Plan: CBC, TSH, Comp Met (CMET)  Menopausal symptoms - Plan: estradiol (VIVELLE-DOT) 0.05 MG/24HR patch  Mammogram: High Risk for breast cancer. Mother died age 27, sister with breast cancer age 65. Pt reported that she will call.

## 2020-08-30 LAB — URINALYSIS, COMPLETE W/RFL CULTURE
Bilirubin Urine: NEGATIVE
Glucose, UA: NEGATIVE
Hyaline Cast: NONE SEEN /LPF
Leukocyte Esterase: NEGATIVE
Nitrites, Initial: NEGATIVE
Protein, ur: NEGATIVE
Specific Gravity, Urine: 1.028 (ref 1.001–1.03)
pH: 5.5 (ref 5.0–8.0)

## 2020-08-30 LAB — CULTURE INDICATED

## 2020-08-30 LAB — URINE CULTURE
MICRO NUMBER:: 11524659
SPECIMEN QUALITY:: ADEQUATE

## 2020-09-02 LAB — CYTOLOGY - PAP
Comment: NEGATIVE
Diagnosis: NEGATIVE
Diagnosis: REACTIVE
High risk HPV: NEGATIVE

## 2020-09-03 NOTE — Telephone Encounter (Signed)
Called patient. Voice mail not set up so I could not leave a message.

## 2020-09-09 ENCOUNTER — Telehealth: Payer: Self-pay | Admitting: *Deleted

## 2020-09-09 DIAGNOSIS — D751 Secondary polycythemia: Secondary | ICD-10-CM

## 2020-09-09 NOTE — Telephone Encounter (Signed)
Per result note on 08/28/20 "Please call patient and notify that her cholesterol is elevated, She should discuss with her PCP. Also, she has a condition called polycythemia. This means that her hemoglobin and hematocrit are too high. This can have various causes and needs to be evaluated further. She can discuss with her PCP, but my preference would be to refer to Hematology (considering mild elevated in Liver enzyme and pt's overall feeling of being "run-down"). Please refer to hematology if patient is agreeable."  Patient would like to proceed with referral, placed in epic they will call to schedule.

## 2020-09-18 NOTE — Telephone Encounter (Signed)
I tried to call patient to tell her to read her my chart message however her voicemail is not set up.

## 2020-09-26 DIAGNOSIS — D751 Secondary polycythemia: Secondary | ICD-10-CM | POA: Insufficient documentation

## 2020-09-26 NOTE — Progress Notes (Signed)
[facility]  [phone] [phone]  ID: JALEEAH SLIGHT OB: 10-Jul-1966  MR#: [id number]  LKG#:[id number]  Patient Care Team: Dorothey Baseman, MD as PCP - General (Family Medicine)  CHIEF COMPLAINT: Polycythemia.  INTERVAL HISTORY: Patient is a 55 year old female who was noted to have an increased hemoglobin on recent routine blood work.  She has chronic weakness and fatigue, but otherwise feels well.  She has no neurologic complaints.  She denies any recent fevers or illnesses.  She has a good appetite and denies weight loss.  She has no chest pain, shortness of breath, cough, or hemoptysis.  She denies any nausea, vomiting, constipation, or diarrhea.  She has no urinary complaints.  Patient otherwise feels well and offers no further specific complaints today.   REVIEW OF SYSTEMS:   Review of Systems  Constitutional: Positive for malaise/fatigue. Negative for chills and fever.  Respiratory: Negative.  Negative for cough, hemoptysis and shortness of breath.   Cardiovascular: Negative.  Negative for chest pain and leg swelling.  Gastrointestinal: Negative.  Negative for abdominal pain, blood in stool and melena.  Genitourinary: Negative.  Negative for dysuria.  Musculoskeletal: Negative.  Negative for back pain.  Skin: Negative.  Negative for rash.  Neurological: Positive for weakness. Negative for dizziness, focal weakness and headaches.  Psychiatric/Behavioral: Negative.  The patient is not nervous/anxious.     As per HPI. Otherwise, a complete review of systems is negative.  PAST MEDICAL HISTORY: Past Medical History:  Diagnosis Date  . ADHD (attention deficit hyperactivity disorder)   . Anxiety   . Depression   . STD (sexually transmitted disease)    HSV  . Vertigo    1-2x/week    PAST SURGICAL HISTORY: Past Surgical History:  Procedure Laterality Date  . ABDOMINAL HYSTERECTOMY    . CYST EXCISION  03/23/2005   Tongue.  Dr  Chestine Spore, [facility]  . DENTAL SURGERY    . MAXILLARY ANTROSTOMY Bilateral 05/16/2018   Procedure: BALLOON SINUPLASTY OF MAXILLARY SINUSES;  Surgeon: Bud Face, MD;  Location: [facility];  Service: ENT;  Laterality: Bilateral;  . TONSILLECTOMY    . TURBINATE REDUCTION Bilateral 05/16/2018   Procedure: INFERIOR TURBINATE REDUCTION;  Surgeon: Bud Face, MD;  Location: [facility];  Service: ENT;  Laterality: Bilateral;    FAMILY HISTORY: Family History  Problem Relation Age of Onset  . Breast cancer Mother   . Breast cancer Sister   . Alcohol abuse Maternal Grandfather   . Diabetes Maternal Grandmother   . Atrial fibrillation Maternal Grandmother   . Hypertension Sister   . Heart attack Father     ADVANCED DIRECTIVES (Y/N):  N  HEALTH MAINTENANCE: Social History   Tobacco Use  . Smoking status: Former Smoker    Types: Cigarettes    Start date: 02/18/1985    Quit date: 02/18/2013    Years since quitting: 7.6  . Smokeless tobacco: Never Used  Vaping Use  . Vaping Use: Never used  Substance Use Topics  . Alcohol use: No    Alcohol/week: 0.0 standard drinks  . Drug use: No     Colonoscopy:  PAP:  Bone density:  Lipid panel:  Allergies  Allergen Reactions  . Other Swelling    Bee sting  . Poison Oak Extract Swelling    Itching     Current Outpatient Medications  Medication Sig Dispense Refill  . diphenhydrAMINE (BENADRYL) 25 MG tablet Take 25 mg by mouth every 6 (six) hours as needed.    Marland Kitchen  estradiol (VIVELLE-DOT) 0.05 MG/24HR patch Place 1 patch (0.05 mg total) onto the skin 2 (two) times a week. 8 patch 12  . traZODone (DESYREL) 100 MG tablet Take 50 mg by mouth at bedtime as needed for sleep.    . valACYclovir (VALTREX) 1000 MG tablet Take 1 tablet (1,000 mg total) by mouth 2 (two) times daily. 30 tablet 3  . amphetamine-dextroamphetamine (ADDERALL) 20 MG tablet Take by mouth. (Patient not taking: Reported on 09/29/2020)    . montelukast  (SINGULAIR) 10 MG tablet Take 10 mg by mouth at bedtime. (Patient not taking: Reported on 09/29/2020)     No current facility-administered medications for this visit.    OBJECTIVE: Vitals:   09/29/20 1120  BP: (!) 149/89  Pulse: 66  Resp: 18  Temp: (!) 96.7 F (35.9 C)     Body mass index is 35.85 kg/m.    ECOG FS:0 - Asymptomatic  General: Well-developed, well-nourished, no acute distress. Eyes: Pink conjunctiva, anicteric sclera. HEENT: Normocephalic, moist mucous membranes. Lungs: No audible wheezing or coughing. Heart: Regular rate and rhythm. Abdomen: Soft, nontender, no obvious distention. Musculoskeletal: No edema, cyanosis, or clubbing. Neuro: Alert, answering all questions appropriately. Cranial nerves grossly intact. Skin: No rashes or petechiae noted. Psych: Normal affect. Lymphatics: No cervical, calvicular, axillary or inguinal LAD.   LAB RESULTS:  Lab Results  Component Value Date   NA 139 08/28/2020   K 4.1 08/28/2020   CL 101 08/28/2020   CO2 28 08/28/2020   GLUCOSE 88 08/28/2020   BUN 16 08/28/2020   CREATININE 0.71 08/28/2020   CALCIUM 9.9 08/28/2020   PROT 7.0 08/28/2020   ALBUMIN 3.9 09/16/2015   AST 26 08/28/2020   ALT 40 (H) 08/28/2020   ALKPHOS 74 09/16/2015   BILITOT 0.5 08/28/2020   GFRNONAA >60 01/22/2020   GFRAA >60 01/22/2020    Lab Results  Component Value Date   WBC 12.1 (H) 09/29/2020   NEUTROABS 6.8 09/16/2015   HGB 16.0 (H) 09/29/2020   HCT 46.5 (H) 09/29/2020   MCV 91.7 09/29/2020   PLT 275 09/29/2020     STUDIES: No results found.  ASSESSMENT: Polycythemia.  PLAN:    1. Polycythemia: Previously lab work revealed patient's hemoglobin to be 16.7.  Today's result is 16.0.  All of her other laboratory work from today including iron stores, erythropoietin level, and carbon monoxide level are pending at time of dictation.  Patient declined JAK2 and hemochromatosis mutation testing secondary to cost, but would be willing  to draw these labs in the future if necessary.  No intervention is needed at this time.  Patient have video assisted telemedicine visit in 3 weeks to discuss her results. 2.  Leukocytosis: Likely reactive, but peripheral blood flow cytometry has been ordered for completeness.  I spent a total of 45 minutes reviewing chart data, face-to-face evaluation with the patient, counseling and coordination of care as detailed above.   Patient expressed understanding and was in agreement with this plan. She also understands that She can call clinic at any time with any questions, concerns, or complaints.    Jeralyn Ruths, MD   09/29/2020 12:24 PM

## 2020-09-29 ENCOUNTER — Inpatient Hospital Stay: Payer: BC Managed Care – PPO

## 2020-09-29 ENCOUNTER — Encounter: Payer: Self-pay | Admitting: Oncology

## 2020-09-29 ENCOUNTER — Inpatient Hospital Stay: Payer: BC Managed Care – PPO | Attending: Oncology | Admitting: Oncology

## 2020-09-29 DIAGNOSIS — R5383 Other fatigue: Secondary | ICD-10-CM | POA: Diagnosis not present

## 2020-09-29 DIAGNOSIS — F909 Attention-deficit hyperactivity disorder, unspecified type: Secondary | ICD-10-CM | POA: Diagnosis not present

## 2020-09-29 DIAGNOSIS — D751 Secondary polycythemia: Secondary | ICD-10-CM

## 2020-09-29 DIAGNOSIS — D72829 Elevated white blood cell count, unspecified: Secondary | ICD-10-CM | POA: Insufficient documentation

## 2020-09-29 DIAGNOSIS — R531 Weakness: Secondary | ICD-10-CM | POA: Insufficient documentation

## 2020-09-29 LAB — CBC
HCT: 46.5 % — ABNORMAL HIGH (ref 36.0–46.0)
Hemoglobin: 16 g/dL — ABNORMAL HIGH (ref 12.0–15.0)
MCH: 31.6 pg (ref 26.0–34.0)
MCHC: 34.4 g/dL (ref 30.0–36.0)
MCV: 91.7 fL (ref 80.0–100.0)
Platelets: 275 10*3/uL (ref 150–400)
RBC: 5.07 MIL/uL (ref 3.87–5.11)
RDW: 13 % (ref 11.5–15.5)
WBC: 12.1 10*3/uL — ABNORMAL HIGH (ref 4.0–10.5)
nRBC: 0 % (ref 0.0–0.2)

## 2020-09-29 LAB — IRON AND TIBC
Iron: 115 ug/dL (ref 28–170)
Saturation Ratios: 30 % (ref 10.4–31.8)
TIBC: 386 ug/dL (ref 250–450)
UIBC: 271 ug/dL

## 2020-09-29 LAB — FERRITIN: Ferritin: 128 ng/mL (ref 11–307)

## 2020-09-29 NOTE — Progress Notes (Signed)
New patient referred by Dr Durwin Nora for polycythemia.

## 2020-09-30 LAB — CARBON MONOXIDE, BLOOD (PERFORMED AT REF LAB): Carbon Monoxide, Blood: 2 % (ref 0.0–3.6)

## 2020-09-30 LAB — ERYTHROPOIETIN: Erythropoietin: 13.9 m[IU]/mL (ref 2.6–18.5)

## 2020-09-30 NOTE — Telephone Encounter (Signed)
Patient was seen on 09/29/20

## 2020-10-01 LAB — COMP PANEL: LEUKEMIA/LYMPHOMA

## 2020-10-02 ENCOUNTER — Other Ambulatory Visit: Payer: Self-pay | Admitting: Family Medicine

## 2020-10-02 DIAGNOSIS — Z1231 Encounter for screening mammogram for malignant neoplasm of breast: Secondary | ICD-10-CM

## 2020-10-15 NOTE — Progress Notes (Deleted)
[facility]  Telephone:([phone]) [phone] Fax:([phone]) [phone]  ID: ELEEN LITZ OB: 06/01/1966  MR#: [id number]  QHU#:[id number]  Patient Care Team: Dorothey Baseman, MD as PCP - General (Family Medicine)  I connected with Blanche East on 10/15/20 at  3:30 PM EDT by {Blank single:19197::"video enabled telemedicine visit","telephone visit"} and verified that I am speaking with the correct person using two identifiers.   I discussed the limitations, risks, security and privacy concerns of performing an evaluation and management service by telemedicine and the availability of in-person appointments. I also discussed with the patient that there may be a patient responsible charge related to this service. The patient expressed understanding and agreed to proceed.   Other persons participating in the visit and their role in the encounter: Patient, MD.  Patient's location: Home. Provider's location: Clinic.  CHIEF COMPLAINT: Polycythemia.  INTERVAL HISTORY: Patient is a 55 year old female who was noted to have an increased hemoglobin on recent routine blood work.  She has chronic weakness and fatigue, but otherwise feels well.  She has no neurologic complaints.  She denies any recent fevers or illnesses.  She has a good appetite and denies weight loss.  She has no chest pain, shortness of breath, cough, or hemoptysis.  She denies any nausea, vomiting, constipation, or diarrhea.  She has no urinary complaints.  Patient otherwise feels well and offers no further specific complaints today.   REVIEW OF SYSTEMS:   Review of Systems  Constitutional: Positive for malaise/fatigue. Negative for chills and fever.  Respiratory: Negative.  Negative for cough, hemoptysis and shortness of breath.   Cardiovascular: Negative.  Negative for chest pain and leg swelling.  Gastrointestinal: Negative.  Negative for abdominal pain, blood in stool and melena.  Genitourinary: Negative.  Negative  for dysuria.  Musculoskeletal: Negative.  Negative for back pain.  Skin: Negative.  Negative for rash.  Neurological: Positive for weakness. Negative for dizziness, focal weakness and headaches.  Psychiatric/Behavioral: Negative.  The patient is not nervous/anxious.     As per HPI. Otherwise, a complete review of systems is negative.  PAST MEDICAL HISTORY: Past Medical History:  Diagnosis Date  . ADHD (attention deficit hyperactivity disorder)   . Anxiety   . Depression   . STD (sexually transmitted disease)    HSV  . Vertigo    1-2x/week    PAST SURGICAL HISTORY: Past Surgical History:  Procedure Laterality Date  . ABDOMINAL HYSTERECTOMY    . CYST EXCISION  03/23/2005   Tongue.  Dr Chestine Spore, [facility]  . DENTAL SURGERY    . MAXILLARY ANTROSTOMY Bilateral 05/16/2018   Procedure: BALLOON SINUPLASTY OF MAXILLARY SINUSES;  Surgeon: Bud Face, MD;  Location: [facility];  Service: ENT;  Laterality: Bilateral;  . TONSILLECTOMY    . TURBINATE REDUCTION Bilateral 05/16/2018   Procedure: INFERIOR TURBINATE REDUCTION;  Surgeon: Bud Face, MD;  Location: [facility];  Service: ENT;  Laterality: Bilateral;    FAMILY HISTORY: Family History  Problem Relation Age of Onset  . Breast cancer Mother   . Breast cancer Sister   . Alcohol abuse Maternal Grandfather   . Diabetes Maternal Grandmother   . Atrial fibrillation Maternal Grandmother   . Hypertension Sister   . Heart attack Father     ADVANCED DIRECTIVES (Y/N):  N  HEALTH MAINTENANCE: Social History   Tobacco Use  . Smoking status: Former Smoker    Types: Cigarettes    Start date: 02/18/1985    Quit date: 02/18/2013  Years since quitting: 7.6  . Smokeless tobacco: Never Used  Vaping Use  . Vaping Use: Never used  Substance Use Topics  . Alcohol use: No    Alcohol/week: 0.0 standard drinks  . Drug use: No     Colonoscopy:  PAP:  Bone density:  Lipid panel:  Allergies  Allergen  Reactions  . Other Swelling    Bee sting  . Poison Oak Extract Swelling    Itching     Current Outpatient Medications  Medication Sig Dispense Refill  . amphetamine-dextroamphetamine (ADDERALL) 20 MG tablet Take by mouth. (Patient not taking: Reported on 09/29/2020)    . diphenhydrAMINE (BENADRYL) 25 MG tablet Take 25 mg by mouth every 6 (six) hours as needed.    Marland Kitchen estradiol (VIVELLE-DOT) 0.05 MG/24HR patch Place 1 patch (0.05 mg total) onto the skin 2 (two) times a week. 8 patch 12  . montelukast (SINGULAIR) 10 MG tablet Take 10 mg by mouth at bedtime. (Patient not taking: Reported on 09/29/2020)    . traZODone (DESYREL) 100 MG tablet Take 50 mg by mouth at bedtime as needed for sleep.    . valACYclovir (VALTREX) 1000 MG tablet Take 1 tablet (1,000 mg total) by mouth 2 (two) times daily. 30 tablet 3   No current facility-administered medications for this visit.    OBJECTIVE: There were no vitals filed for this visit.   There is no height or weight on file to calculate BMI.    ECOG FS:0 - Asymptomatic  General: Well-developed, well-nourished, no acute distress. Eyes: Pink conjunctiva, anicteric sclera. HEENT: Normocephalic, moist mucous membranes. Lungs: No audible wheezing or coughing. Heart: Regular rate and rhythm. Abdomen: Soft, nontender, no obvious distention. Musculoskeletal: No edema, cyanosis, or clubbing. Neuro: Alert, answering all questions appropriately. Cranial nerves grossly intact. Skin: No rashes or petechiae noted. Psych: Normal affect. Lymphatics: No cervical, calvicular, axillary or inguinal LAD.   LAB RESULTS:  Lab Results  Component Value Date   NA 139 08/28/2020   K 4.1 08/28/2020   CL 101 08/28/2020   CO2 28 08/28/2020   GLUCOSE 88 08/28/2020   BUN 16 08/28/2020   CREATININE 0.71 08/28/2020   CALCIUM 9.9 08/28/2020   PROT 7.0 08/28/2020   ALBUMIN 3.9 09/16/2015   AST 26 08/28/2020   ALT 40 (H) 08/28/2020   ALKPHOS 74 09/16/2015   BILITOT 0.5  08/28/2020   GFRNONAA >60 01/22/2020   GFRAA >60 01/22/2020    Lab Results  Component Value Date   WBC 12.1 (H) 09/29/2020   NEUTROABS 6.8 09/16/2015   HGB 16.0 (H) 09/29/2020   HCT 46.5 (H) 09/29/2020   MCV 91.7 09/29/2020   PLT 275 09/29/2020     STUDIES: No results found.  ASSESSMENT: Polycythemia.  PLAN:    1. Polycythemia: Previously lab work revealed patient's hemoglobin to be 16.7.  Today's result is 16.0.  All of her other laboratory work from today including iron stores, erythropoietin level, and carbon monoxide level are pending at time of dictation.  Patient declined JAK2 and hemochromatosis mutation testing secondary to cost, but would be willing to draw these labs in the future if necessary.  No intervention is needed at this time.  Patient have video assisted telemedicine visit in 3 weeks to discuss her results. 2.  Leukocytosis: Likely reactive, but peripheral blood flow cytometry has been ordered for completeness.  I provided *** minutes of {Blank single:19197::"face-to-face video visit time","non face-to-face telephone visit time"} during this encounter which included chart review, counseling, and  coordination of care as documented above.   Patient expressed understanding and was in agreement with this plan. She also understands that She can call clinic at any time with any questions, concerns, or complaints.    Jeralyn Ruths, MD   10/15/2020 8:48 AM

## 2020-10-20 ENCOUNTER — Inpatient Hospital Stay: Payer: BC Managed Care – PPO | Admitting: Oncology

## 2020-10-20 DIAGNOSIS — D751 Secondary polycythemia: Secondary | ICD-10-CM

## 2020-10-22 ENCOUNTER — Other Ambulatory Visit: Payer: Self-pay | Admitting: Family Medicine

## 2020-10-22 ENCOUNTER — Ambulatory Visit
Admission: RE | Admit: 2020-10-22 | Discharge: 2020-10-22 | Disposition: A | Discharge status: Home / Self Care | Payer: BC Managed Care – PPO | Source: Ambulatory Visit | Attending: Family Medicine | Admitting: Family Medicine

## 2020-10-22 ENCOUNTER — Other Ambulatory Visit: Payer: Self-pay

## 2020-10-22 DIAGNOSIS — Z1231 Encounter for screening mammogram for malignant neoplasm of breast: Secondary | ICD-10-CM | POA: Diagnosis present

## 2020-10-29 ENCOUNTER — Other Ambulatory Visit: Payer: Self-pay | Admitting: Family Medicine

## 2020-10-29 DIAGNOSIS — N6320 Unspecified lump in the left breast, unspecified quadrant: Secondary | ICD-10-CM

## 2020-10-29 DIAGNOSIS — R928 Other abnormal and inconclusive findings on diagnostic imaging of breast: Secondary | ICD-10-CM

## 2020-11-11 ENCOUNTER — Other Ambulatory Visit: Payer: BC Managed Care – PPO

## 2020-11-11 ENCOUNTER — Ambulatory Visit: Admission: RE | Admit: 2020-11-11 | Payer: BC Managed Care – PPO | Source: Ambulatory Visit

## 2020-11-27 ENCOUNTER — Ambulatory Visit: Payer: BC Managed Care – PPO | Admitting: Nurse Practitioner

## 2020-12-04 ENCOUNTER — Ambulatory Visit: Payer: BC Managed Care – PPO | Admitting: Nurse Practitioner

## 2021-01-22 ENCOUNTER — Other Ambulatory Visit: Payer: Self-pay

## 2021-01-22 ENCOUNTER — Ambulatory Visit
Admission: RE | Admit: 2021-01-22 | Discharge: 2021-01-22 | Disposition: A | Discharge status: Home / Self Care | Payer: BC Managed Care – PPO | Source: Ambulatory Visit | Attending: Family Medicine | Admitting: Family Medicine

## 2021-01-22 DIAGNOSIS — R928 Other abnormal and inconclusive findings on diagnostic imaging of breast: Secondary | ICD-10-CM | POA: Diagnosis present

## 2021-01-22 DIAGNOSIS — N6320 Unspecified lump in the left breast, unspecified quadrant: Secondary | ICD-10-CM | POA: Diagnosis present

## 2021-01-27 ENCOUNTER — Other Ambulatory Visit: Payer: Self-pay | Admitting: Family Medicine

## 2021-01-27 DIAGNOSIS — N6320 Unspecified lump in the left breast, unspecified quadrant: Secondary | ICD-10-CM

## 2021-01-27 DIAGNOSIS — R928 Other abnormal and inconclusive findings on diagnostic imaging of breast: Secondary | ICD-10-CM

## 2021-02-11 ENCOUNTER — Telehealth: Payer: Self-pay

## 2021-02-11 NOTE — Telephone Encounter (Signed)
AEX 08/28/20 with Clarita Crane, NP  Patient called in voice mail for Clarita Crane, NP, unaware she is no longer here.  She c/o menopausal acne and that it is "getting cystic".  She asked if she could get a prescription sent for Clindamycin Benzoyl Peroxide cream.  She said she was not sure what else to do for it.

## 2021-02-12 MED ORDER — CLINDAMYCIN PHOS-BENZOYL PEROX 1-5 % EX GEL: Freq: Two times a day (BID) | CUTANEOUS | 0 refills | Status: DC

## 2021-02-12 NOTE — Telephone Encounter (Signed)
Dr.Lavoie replied  "Agree with Clindamycin Benzoyl Peroxide.  "  Patient aware Rx sent, will follow with a dermatologist in future for mediation.

## 2022-10-17 DEATH — deceased

## 2023-04-06 IMAGING — MG MM DIGITAL DIAGNOSTIC UNILAT*L* IMPLANT W/ TOMO W/ CAD
4 series · 4 of 12 positions shown · non-contrast
Comparison: Previous exam(s).

CLINICAL DATA: Recall from screening mammography, 2 possible masses
involving the LEFT breast. The patient has indwelling retropectoral
saline implants.

Strong family history of breast cancer in her mother who died at age
27 and in a sister.
EXAM:
DIGITAL DIAGNOSTIC UNILATERAL LEFT MAMMOGRAM WITH IMPLANTS, CAD AND
TOMOSYNTHESIS; ULTRASOUND LEFT BREAST LIMITED
TECHNIQUE: Left digital diagnostic mammography and breast tomosynthesis was
performed. The images were evaluated with computer-aided detection.
Standard and/or implant displaced views were performed.; Targeted
ultrasound examination of the left breast was performed

[L MLO synth-2D]
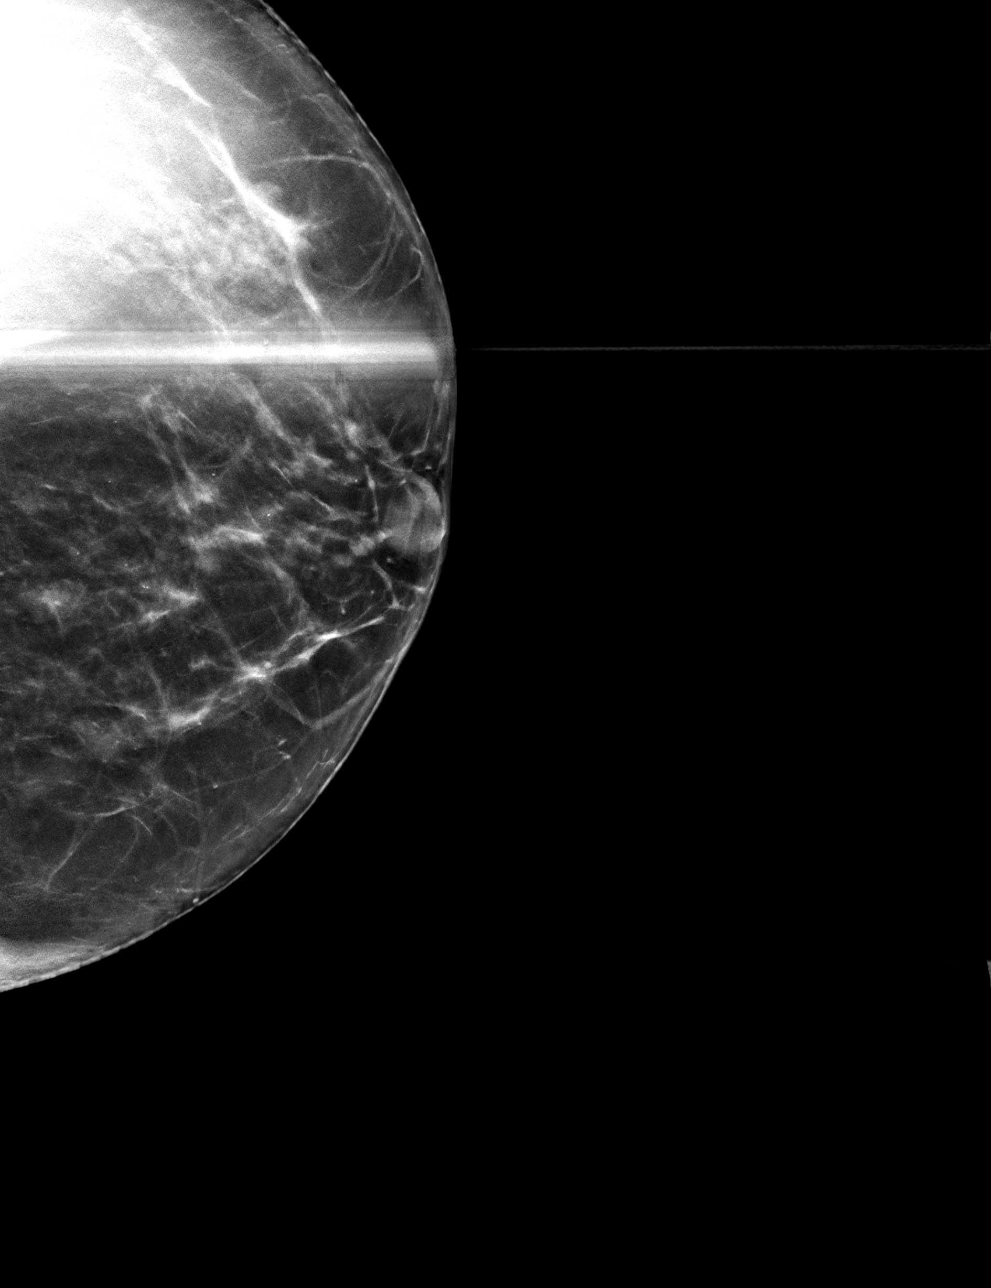

[L CC synth-2D]
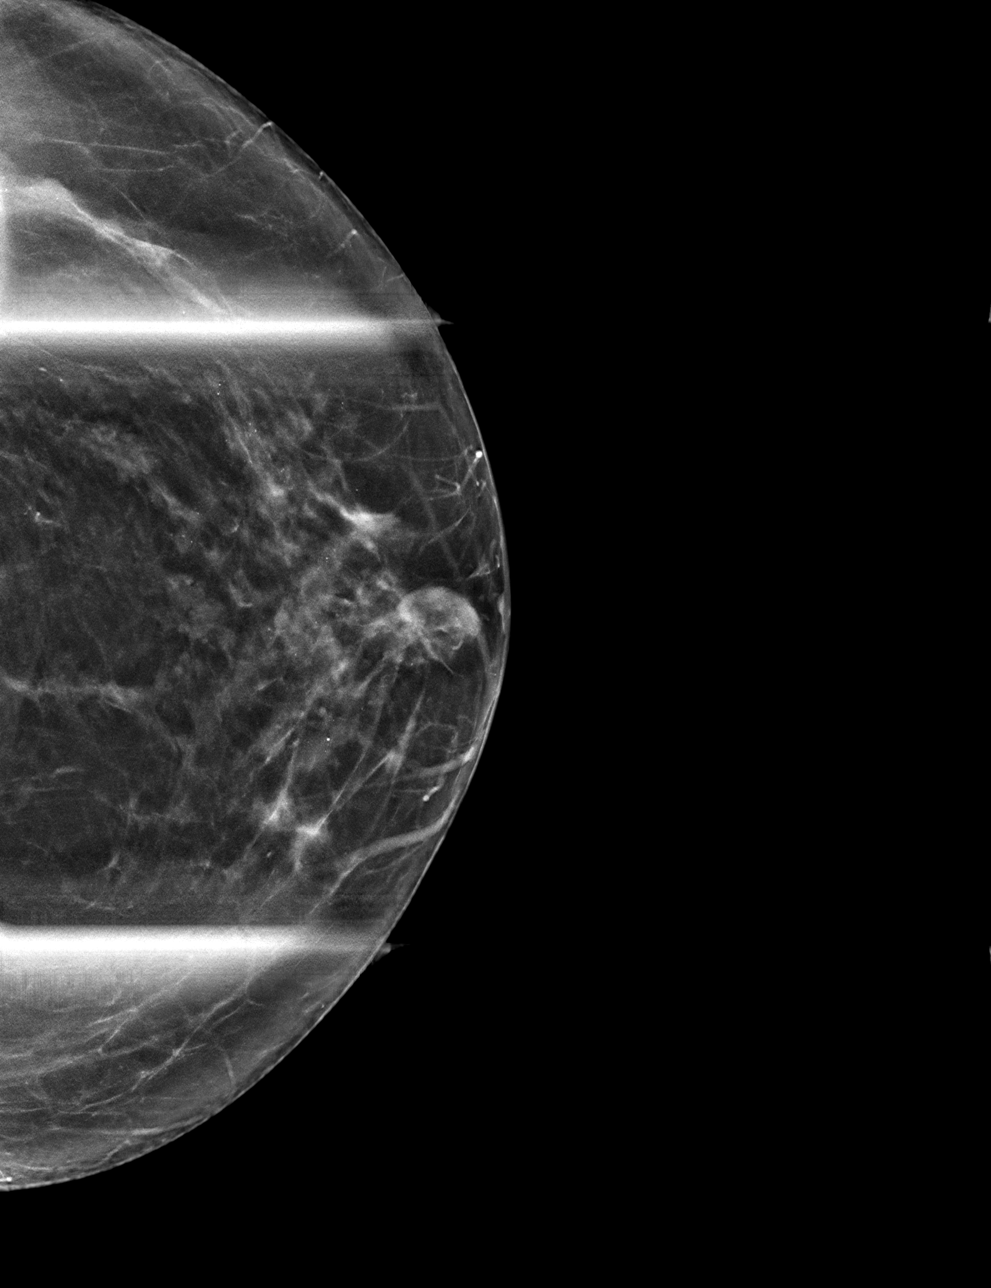

[L CCID BREAST TOMOSYNTHESIS IMAGE tomo · tomo slice 31/61.0]
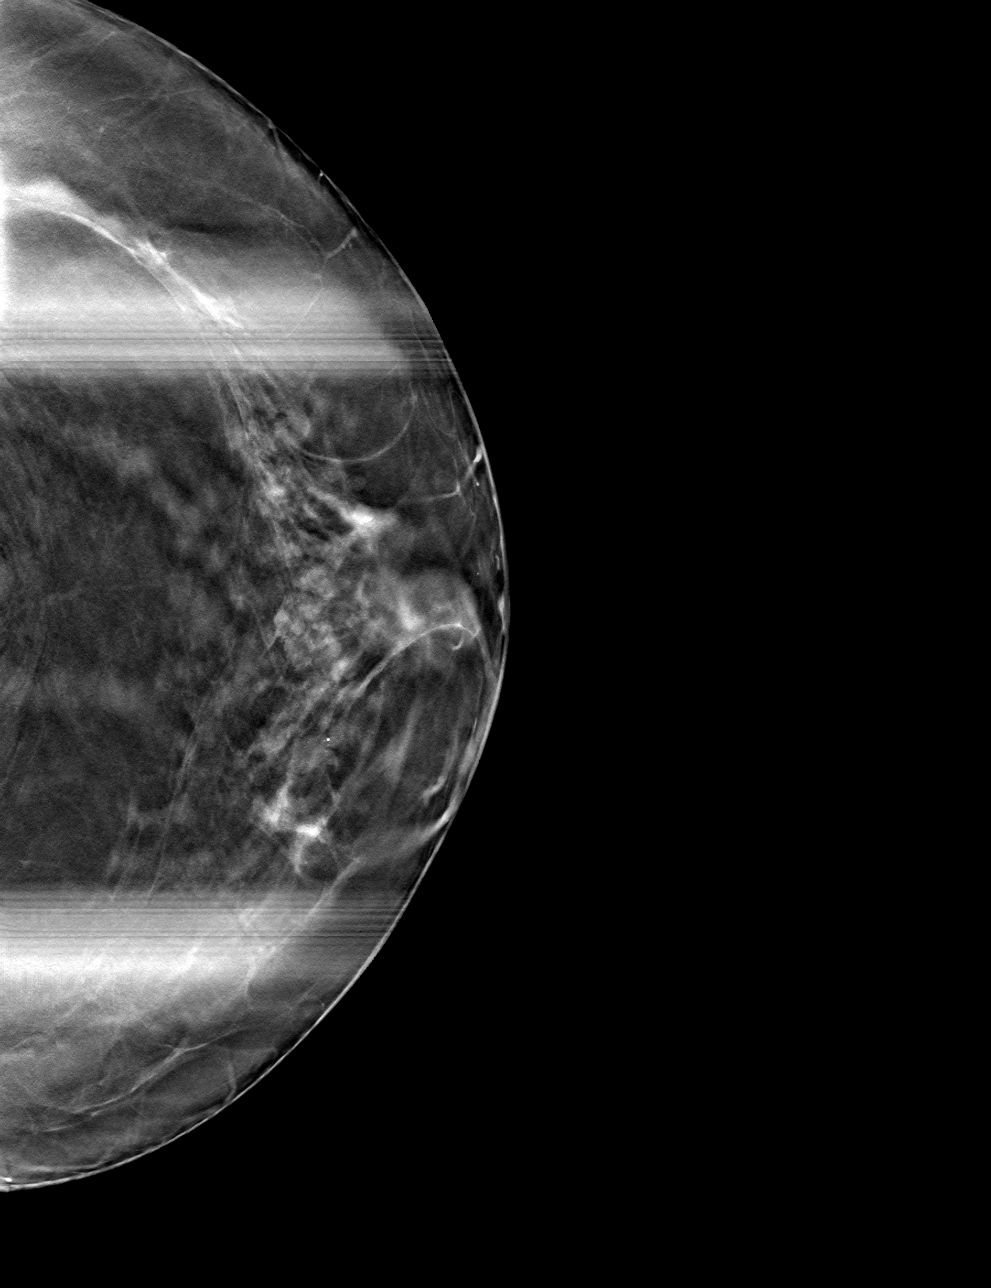

[L MLOID BREAST TOMOSYNTHESIS IMAGE tomo · tomo slice 28/55.0]
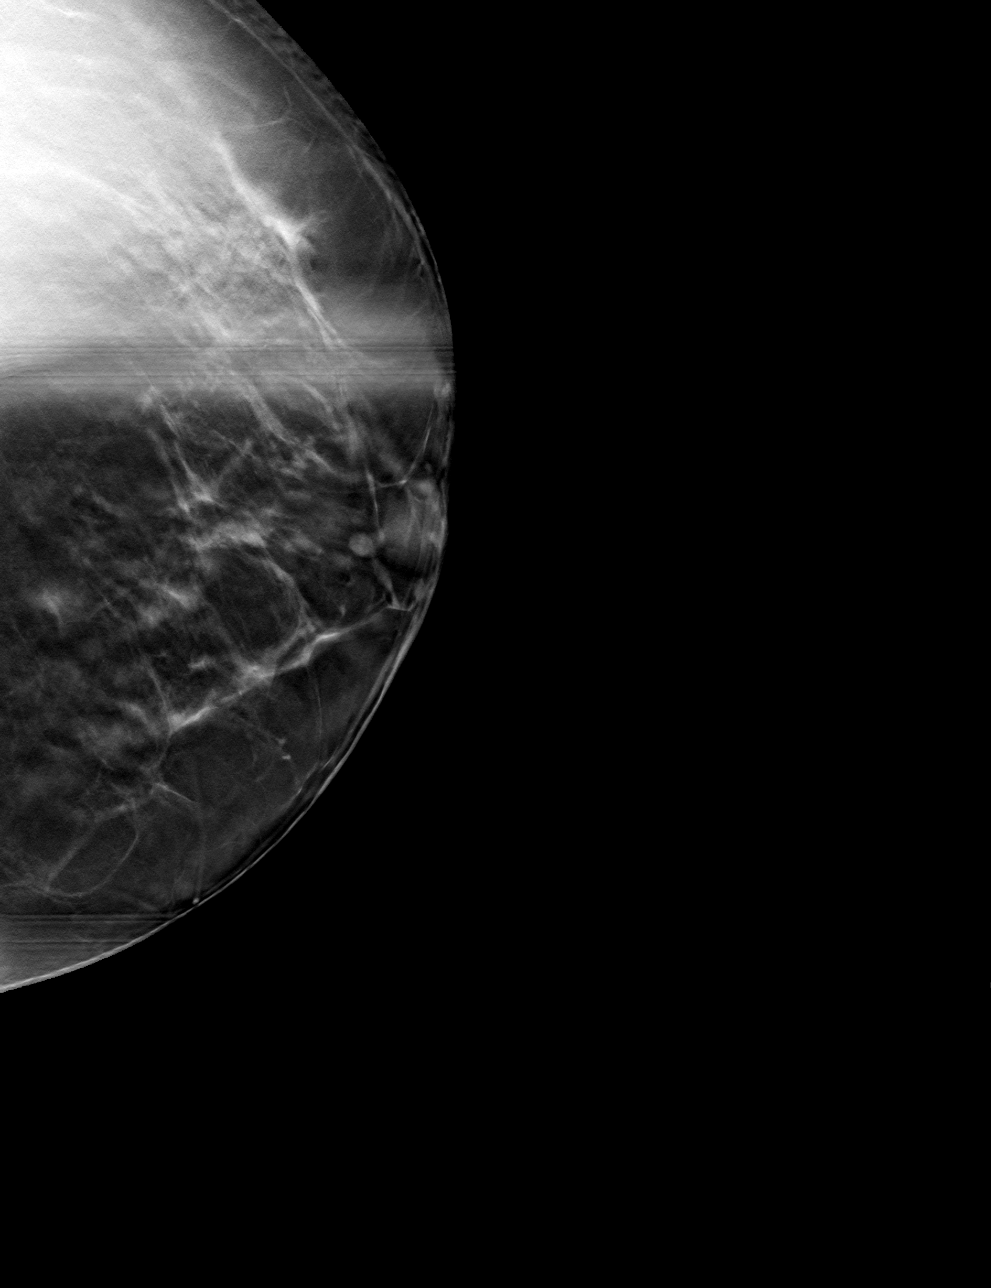

[4 of 12 positions shown; findings below may reference images not displayed]

ACR Breast Density Category c: The breast tissue is heterogeneously
dense, which may obscure small masses.
FINDINGS: Spot-compression implant displaced CC and MLO views of the areas of
concern were obtained. The patient has retropectoral saline
implants.

The mass questioned in the LOWER OUTER QUADRANT at posterior depth
persists and is partially compressible, measuring approximately
cm, is isodense and has a macrolobulated appearance as can be seen
in clustered cysts. There is no associated architectural distortion.
There are scattered faint punctate calcifications associated with
the mass.

The mass questioned in the lower breast at posterior depth, near 6
o'clock location, also persists and partially disperses with
compression, measuring approximately 0.8 cm, and is isodense. There
is no convincing associated architectural distortion. There are
scattered faint punctate calcifications associated with the mass.
The mass is contiguous with a linear band of glandular tissue.

Targeted ultrasound is performed, demonstrating multiple likely
benign findings. At the 4 o'clock position approximately 4 cm from
the nipple are likely benign clustered cysts measuring 1.0 x 0.5 x
0.6 cm, demonstrating posterior acoustic enhancement and no internal
color Doppler flow, possibly accounting for the screening
mammographic finding in the LOWER OUTER QUADRANT.

At the 6 o'clock position approximately 1 cm from nipple are likely
benign clustered cysts measuring 1.1 x 0.8 x 0.5 cm, demonstrating
posterior acoustic enhancement and no internal color Doppler flow.

At the 3 o'clock position approximately 9 cm from the nipple at
posterior depth is a circumscribed oval parallel anechoic mass with
internal echoes measuring 0.9 x 0.5 x 0.7 cm, demonstrating
posterior acoustic enhancement and no internal color Doppler flow.

At the 7 o'clock position approximately 2 cm from the nipple within
a linear band of fibroglandular tissue are likely benign clustered
cysts measuring approximately 1.2 x 0.4 x 0.6 cm, demonstrating no
posterior characteristics and no internal color Doppler flow, likely
accounting for the screening mammographic finding in the lower
breast.
IMPRESSION: Multiple likely benign findings in the LEFT breast as detailed
above, including numerous clustered cysts and a likely complicated
cyst.

RECOMMENDATION:
Diagnostic LEFT mammogram and LEFT breast ultrasound in 6 months.

I have discussed the findings and recommendations with the patient.
If applicable, a reminder letter will be sent to the patient
regarding the next appointment.

BI-RADS CATEGORY  3: Probably benign.
